# Patient Record
Sex: Female | Born: 1965 | Race: White | Hispanic: Yes | Smoking: Never smoker
Health system: Southern US, Community
[De-identification: ages and names within clinical notes are randomized; demographics above are authoritative.]

## PROBLEM LIST (undated history)

## (undated) DIAGNOSIS — Z8042 Family history of malignant neoplasm of prostate: Secondary | ICD-10-CM

## (undated) DIAGNOSIS — Z800 Family history of malignant neoplasm of digestive organs: Secondary | ICD-10-CM

## (undated) DIAGNOSIS — Z803 Family history of malignant neoplasm of breast: Secondary | ICD-10-CM

## (undated) HISTORY — DX: Family history of malignant neoplasm of digestive organs: Z80.0

## (undated) HISTORY — DX: Family history of malignant neoplasm of breast: Z80.3

## (undated) HISTORY — PX: CHOLECYSTECTOMY: SHX55

## (undated) HISTORY — DX: Family history of malignant neoplasm of prostate: Z80.42

## (undated) HISTORY — PX: SHOULDER SURGERY: SHX246

---

## 2010-07-24 DIAGNOSIS — K900 Celiac disease: Secondary | ICD-10-CM | POA: Insufficient documentation

## 2018-03-10 ENCOUNTER — Other Ambulatory Visit: Payer: Self-pay

## 2018-03-10 ENCOUNTER — Encounter: Payer: Self-pay | Admitting: Emergency Medicine

## 2018-03-10 ENCOUNTER — Ambulatory Visit
Admission: EM | Admit: 2018-03-10 | Discharge: 2018-03-10 | Disposition: A | Discharge status: Home / Self Care | Payer: BLUE CROSS/BLUE SHIELD | Attending: Family Medicine | Admitting: Family Medicine

## 2018-03-10 DIAGNOSIS — T07XXXA Unspecified multiple injuries, initial encounter: Secondary | ICD-10-CM

## 2018-03-10 DIAGNOSIS — L089 Local infection of the skin and subcutaneous tissue, unspecified: Secondary | ICD-10-CM

## 2018-03-10 MED ORDER — CYCLOBENZAPRINE HCL 10 MG PO TABS: 10.0000 mg | ORAL_TABLET | Freq: Every day | ORAL | 0 refills | Status: DC

## 2018-03-10 MED ORDER — SULFAMETHOXAZOLE-TRIMETHOPRIM 800-160 MG PO TABS: 1.0000 | ORAL_TABLET | Freq: Two times a day (BID) | ORAL | 0 refills | Status: DC
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
## 2018-03-20 ENCOUNTER — Other Ambulatory Visit: Payer: Self-pay | Admitting: Neurology

## 2018-03-20 DIAGNOSIS — H9311 Tinnitus, right ear: Secondary | ICD-10-CM

## 2018-03-20 DIAGNOSIS — F0781 Postconcussional syndrome: Secondary | ICD-10-CM

## 2018-03-20 DIAGNOSIS — R42 Dizziness and giddiness: Secondary | ICD-10-CM

## 2018-04-07 ENCOUNTER — Ambulatory Visit: Payer: BLUE CROSS/BLUE SHIELD | Attending: Neurology | Admitting: Physical Therapy

## 2018-04-07 DIAGNOSIS — H8113 Benign paroxysmal vertigo, bilateral: Secondary | ICD-10-CM

## 2018-04-07 DIAGNOSIS — M542 Cervicalgia: Secondary | ICD-10-CM | POA: Diagnosis not present

## 2018-04-07 DIAGNOSIS — R42 Dizziness and giddiness: Secondary | ICD-10-CM | POA: Diagnosis present
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
## 2018-04-08 ENCOUNTER — Encounter: Payer: Self-pay | Admitting: Physical Therapy

## 2018-04-09 ENCOUNTER — Ambulatory Visit: Payer: BLUE CROSS/BLUE SHIELD | Admitting: Physical Therapy

## 2018-04-09 ENCOUNTER — Encounter: Payer: Self-pay | Admitting: Physical Therapy

## 2018-04-09 DIAGNOSIS — R42 Dizziness and giddiness: Secondary | ICD-10-CM

## 2018-04-09 DIAGNOSIS — M542 Cervicalgia: Secondary | ICD-10-CM
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
## 2018-04-13 ENCOUNTER — Ambulatory Visit: Payer: BLUE CROSS/BLUE SHIELD | Admitting: Physical Therapy

## 2018-04-13 ENCOUNTER — Encounter: Payer: Self-pay | Admitting: Physical Therapy

## 2018-04-13 DIAGNOSIS — M542 Cervicalgia: Secondary | ICD-10-CM

## 2018-04-13 NOTE — Patient Instructions (Signed)
Access Code: [email or web address]  URL: [email or web address]  Date: 04/13/2018  Prepared by: Sheria Lang   Exercises  Standing Cervical Retraction - 10 reps - 1 sets - 5 hold - 2x daily - 7x weekly  Shoulder External Rotation and Scapular Retraction with Resistance - 10 reps - 3 sets - 3 hold - 1x daily - 4x weekly  Standing Shoulder Row with Resistance - 10 reps - 3 sets - 3 hold - 1x daily - 4x weekly

## 2018-04-13 NOTE — Therapy (Signed)
[place] [facility] [address] [place], [place], [address] Phone: [phone]   Fax:  [phone]  Physical Therapy Treatment  Patient Details  Name: Caitlin Chandler MRN: [id number] Date of Birth: 1965-11-15 No data recorded  Encounter Date: 04/13/2018  PT End of Session - 04/13/18 1553    Visit Number  3    Number of Visits  12    Date for PT Re-Evaluation  05/05/18    Authorization Type  3/10 Progress Note    PT Start Time  1442    PT Stop Time  1536    PT Time Calculation (min)  54 min    Activity Tolerance  Patient tolerated treatment well;No increased pain    Behavior During Therapy  [facility] for tasks assessed/performed       History reviewed. No pertinent past medical history.  Past Surgical History:  Procedure Laterality Date  . SHOULDER SURGERY Left     There were no vitals filed for this visit.  Subjective Assessment - 04/13/18 1548    Subjective  Patient reports that she is feeling much better since her initial evaluation. She was excited to report that she was able to go to dinner with family in a noisy restaurant and had no exacerbation of neck pain or headache. She estimated that with the drive there, the dinner probably took at least 3 hours. She did report that she is nauseuous for ~2 hours after her vestibular HEP.    Currently in Pain?  No/denies       TREATMENT  Therapeutic Exercise: Supine chin tucks 10x 5 sec hold Seated chin tucks 10 x 5 sec hold Seated Scap retraction with ER x10, RTB Unilateral rows B 2x10, RTB Weight bearing through hands (modified chaturunga prep for yoga prep) x5  Patient educated on grading activity for returning to previous physical activities and gradually returning to her usual routine.  Manual Therapy: Prone CPA/UPAs T7-C4 Supine subocciptal release Supine cervical extension mobilizations, tender at C4/5, no change with repetition 2-3/10 pain Supine upper trap stretch B,  4x20 sec Supine levator scap stretch B, 4x20 sec Seated UT/ periscapular STM    PT Education - 04/13/18 1551    Education Details  graded exposure to activity (yoga, body pump), exercise technique    Person(s) Educated  Patient    Methods  Explanation;Demonstration;Tactile cues;Verbal cues;Handout    Comprehension  Verbalized understanding;Returned demonstration          PT Long Term Goals - 04/07/18 1852      PT LONG TERM GOAL #1   Title  Pt will be independent with HEP in order to improve strength and decrease back pain in order to improve pain-free function at home and work.       Baseline  Pt. not given HEP at this time.    Time  4    Period  Weeks    Status  New    Target Date  05/05/18      PT LONG TERM GOAL #2   Title  Pt. will complete FOTO and improve to 71 to improve daily functional mobility.    Baseline  04/07/18 FOTO: 58    Time  4    Period  Weeks    Status  New    Target Date  05/05/18      PT LONG TERM GOAL #3   Title  Pt will decrease worst neck pain as reported on NPRS by at least 2 points  in order to demonstrate clinically significant reduction in back pain.     Baseline  Pt. unable to give NPRS at this time.    Time  4    Period  Weeks    Status  New    Target Date  05/05/18      PT LONG TERM GOAL #4   Title  Pt. will decrease dizziness/vertigo symptom severity to be 0/10 after Dix-Hallpike test.    Baseline  04/07/18: Dix-Hallpike Sx Severity R/L: 6/5    Time  4    Period  Weeks    Status  New    Target Date  05/05/18            Plan - 04/13/18 1555    Clinical Impression Statement Patient presents with improved function and decreased pain (2/10) and was amenable to therapy. She continues to have deficits in pain (neck and upper back) and activity tolerance (driving <16 minutes) which impair her ability to function at her prior level. She will continue to benefit from skilled therapeutic intervention to address her remaining deficits and  improve her overall QOL.   Rehab Potential  Good    PT Frequency  3x / week    PT Duration  4 weeks    PT Treatment/Interventions  Canalith Repostioning;Cryotherapy;Electrical Stimulation;Traction;Moist Heat;Gait training;Stair training;Functional mobility training;Therapeutic activities;Therapeutic exercise;Balance training;Neuromuscular re-education;Patient/family education;Manual techniques;Vestibular;Taping;Splinting;Dry needling;Passive range of motion;Visual/perceptual remediation/compensation;Joint Manipulations;Spinal Manipulations    PT Next Visit Plan  Progress postural strengthening as tolerated. Review HEP; try activites on uneven surfaces, progressions of vestibular exercises    PT Home Exercise Plan  VOR x1 in standing 30 seconds, in sitting-ball toss to self with head/eye follows horizontal and vertical     Consulted and Agree with Plan of Care  Patient       Patient will benefit from skilled therapeutic intervention in order to improve the following deficits and impairments:  Decreased activity tolerance, Increased fascial restricitons, Decreased balance, Decreased mobility, Impaired vision/preception, Decreased cognition, Dizziness, Decreased coordination, Impaired perceived functional ability, Pain  Visit Diagnosis: Pain in neck     Problem List There are no active problems to display for this patient.  Sheria Lang PT, DPT [phone]  04/13/2018, 3:56 PM  [place] [facility] [address] [place], [place], [address] Phone: [phone]   Fax:  [phone]  Name: Caitlin Chandler MRN: [id number] Date of Birth: 1966-03-17

## 2018-04-15 ENCOUNTER — Encounter: Payer: BLUE CROSS/BLUE SHIELD | Admitting: Physical Therapy

## 2018-04-16 ENCOUNTER — Ambulatory Visit: Payer: BLUE CROSS/BLUE SHIELD | Admitting: Physical Therapy

## 2018-04-16 DIAGNOSIS — R42 Dizziness and giddiness: Secondary | ICD-10-CM

## 2018-04-16 DIAGNOSIS — H8113 Benign paroxysmal vertigo, bilateral: Secondary | ICD-10-CM

## 2018-04-16 DIAGNOSIS — M542 Cervicalgia: Secondary | ICD-10-CM | POA: Diagnosis not present

## 2018-04-16 NOTE — Therapy (Addendum)
[place] [facility] [address] [place], [place], [address] Phone: [phone]   Fax:  [phone]  Physical Therapy Treatment  Patient Details  Name: Caitlin Chandler MRN: [id number] Date of Birth: [birth year] No data recorded  Encounter Date: 04/16/2018  PT End of Session - 04/16/18 1313    Visit Number  4    Number of Visits  12    Date for PT Re-Evaluation  05/05/18    Authorization Type  4/10 Progress Note    PT Start Time  1311    Activity Tolerance  Patient tolerated treatment well;No increased pain    Behavior During Therapy  [facility] for tasks assessed/performed       No past medical history on file.  Past Surgical History:  Procedure Laterality Date  . SHOULDER SURGERY Left     There were no vitals filed for this visit.  Subjective Assessment - 04/16/18 1312    Subjective  Patient states yesterday was the best day that I have had yet. Patient states she is now tolerating the exercises well and the nausea has subsided. Patient states she is still misplacing and forgetting things.     Pain Score  3     Pain Location  Head    Pain Orientation  Posterior    Pain Type  Acute pain ; patient reports she feels tension in the posterior head region   Pain Onset  Today       Patient states she has progressed from 30 seconds to 1 minute with the VOR. Patient states she feels better with driving around town. Patient states she still feels worn out at times. Patient states she drove to [facility] on Monday without issue. Patient reports she gets dizzy with looking down and then turning head side to side.    Neuromuscular Re-education: VOR X 1 exercise:  Patient performed VOR X 1 horizontal in standing with conflicting background 3 reps of 1 minute each with verbal cues for technique, speed of movement.   Bounce Passes: Patient performed ambulation 34' trials while doing alternating sides bounce passes to self with ball while tracking  ball with eyes and head with supervision. Patient denied increase in dizziness with this activity.    Body Wall Rolls:  Patient performed 4 reps of supported, body wall rolls with eyes open.  Patient reports 4-5/10 dizziness with this activity.  Added this exercise to HEP.   Airex pad:  On Airex pad, patient performed feet together progressions-progressed to feet and semi-tandem progressions with alternating lead leg with and without body turns. Patient reports this is challenging to her balance.  Cone Sorting Activity: Patient reports that she is getting mild dizziness when she looks down and bends over and then comes back up so tried the following exercise. On firm surface and then repeated on Airex pad, performed transferring multicolored cones from floor to elevated tray table 180 degrees on the other side of patient while patient visually tracks the cone so that patient is required to turn 180 degrees Left and Right to turn to bend over and pick and place cones on the table with S.  Pt without evidence of imbalance and patient reports increase in symptoms to 3/10 with this activity.      PT Education - 04/16/18 1313    Education Details reviewed HEP and added body wall rolls, ambulation with head turns, feet together with head turns, advanced VOR to conflicting background   Person(s) Educated  Patient  Methods  Explanation, demonstrated, verbal cues, handout   Comprehension  Verbalized understanding, returned demonstration       PT Long Term Goals - 04/07/18 1852      PT LONG TERM GOAL #1   Title  Pt will be independent with HEP in order to improve strength and decrease back pain in order to improve pain-free function at home and work.       Baseline  Pt. not given HEP at this time.    Time  4    Period  Weeks    Status  New    Target Date  05/05/18      PT LONG TERM GOAL #2   Title  Pt. will complete FOTO and improve to 71 to improve daily functional mobility.     Baseline  04/07/18 FOTO: 58    Time  4    Period  Weeks    Status  New    Target Date  05/05/18      PT LONG TERM GOAL #3   Title  Pt will decrease worst neck pain as reported on NPRS by at least 2 points in order to demonstrate clinically significant reduction in back pain.     Baseline  Pt. unable to give NPRS at this time.    Time  4    Period  Weeks    Status  New    Target Date  05/05/18      PT LONG TERM GOAL #4   Title  Pt. will decrease dizziness/vertigo symptom severity to be 0/10 after Dix-Hallpike test.    Baseline  04/07/18: Dix-Hallpike Sx Severity R/L: 6/5    Time  4    Period  Weeks    Status  New    Target Date  05/05/18         Plan - 04/27/18 1228    Clinical Impression Statement  Patient reports that she had a very good day yesterday and is encouraged by the progress she has made thus far. Patient reports compliance with HEP and states she is no longer getting nausea symptoms. Patient able to progress to conflicting background with VOR exercise and added body wall rolls and ambulation with random head turns, and feet together with head and body turns to HEP. Patient challenged by body wall rolls this date with reproduction of her dizziness symptoms. Patient would benefit from continued PT services to further address goals as set on plan of care and to try to decrease patient's symptoms of dizziness and imbalance.     Rehab Potential  Good    PT Frequency  3x / week    PT Duration  4 weeks    PT Treatment/Interventions  Canalith Repostioning;Cryotherapy;Electrical Stimulation;Traction;Moist Heat;Gait training;Stair training;Functional mobility training;Therapeutic activities;Therapeutic exercise;Balance training;Neuromuscular re-education;Patient/family education;Manual techniques;Vestibular;Taping;Splinting;Dry needling;Passive range of motion;Visual/perceptual remediation/compensation;Joint Manipulations;Spinal Manipulations    PT Next Visit Plan  Progress postural  strengthening as tolerated. Review HEP; try activites on uneven surfaces, progressions of vestibular exercises    PT Home Exercise Plan  VOR x1 in standing 1 minute with conflicting background, in sitting-ball toss to self with head/eye follows horizontal and vertical, body wall rolls supported EO, ambulation with head turns, feet together with head and body turns    Consulted and Agree with Plan of Care  Patient       Patient will benefit from skilled therapeutic intervention in order to improve the following deficits and impairments:  Decreased activity tolerance, Increased fascial restricitons, Decreased balance, Decreased  mobility, Impaired vision/preception, Decreased cognition, Dizziness, Decreased coordination, Impaired perceived functional ability, Pain  Visit Diagnosis: Dizziness and giddiness   Problem List There are no active problems to display for this patient.  Mardelle Matte PT, DPT [phone] Mardelle Matte 04/16/2018, 1:14 PM  [place] [facility] [address] [place], [place], [address] Phone: [phone]   Fax:  [phone]  Name: Jeslie Lowe MRN: [id number] Date of Birth: 1966/05/24

## 2018-04-20 ENCOUNTER — Encounter: Payer: Self-pay | Admitting: Physical Therapy

## 2018-04-20 ENCOUNTER — Ambulatory Visit: Payer: BLUE CROSS/BLUE SHIELD | Admitting: Physical Therapy

## 2018-04-20 DIAGNOSIS — M542 Cervicalgia: Secondary | ICD-10-CM | POA: Diagnosis not present

## 2018-04-20 DIAGNOSIS — R42 Dizziness and giddiness: Secondary | ICD-10-CM

## 2018-04-20 NOTE — Therapy (Signed)
[place] [facility] [address] [place], [place], [address] Phone: [phone]   Fax:  [phone]  Physical Therapy Treatment  Patient Details  Name: Caitlin Chandler MRN: [id number] Date of Birth: [birth year] No data recorded  Encounter Date: 04/20/2018    PT End of Session - 04/27/18 1156    Visit Number  5    Number of Visits  12    Date for PT Re-Evaluation  05/05/18    Authorization Type  5/10 Progress Note    PT Start Time  1551    PT Stop Time  1644    PT Time Calculation (min)  53 min    Activity Tolerance  Patient tolerated treatment well;No increased pain    Behavior During Therapy  [facility] for tasks assessed/performed        History reviewed. No pertinent past medical history.  Past Surgical History:  Procedure Laterality Date  . SHOULDER SURGERY Left     There were no vitals filed for this visit.  Subjective Assessment - 04/20/18 1553    Subjective  Patient states she went camping over the weekend and was able to be a passenger in the car for a several hour car trip and she did well. Patient states she does feel her depth perception is off and she gets startled at time with thinking that their car is too close to another car.     Currently in Pain?  No/denies    Pain Onset  Today       Patient reports she took a Tylenol after therapy the last time and has not had to take any other.  She did not get a chance to do her exercises over the weekend.  Patient states having gallbladder surgery in a month.   Neuromuscular Re-education:  Walking VOR X 1 exercise:  Patient performed VOR X 1 horizontal in standing with conflicting background 3 reps of 1 minute each with verbal cues for technique.  Patient denies dizziness with this activity.   Newman Pies toss over shoulder: Patient performed multiple 61' trials of forward and retro ambulation while tossing ball over one shoulder with return catch over opposite shoulder with CGA.  Patient reports increase in dizziness with this activity. Patient performed multiple 54' trials of forward and retro ambulation while tossing ball over one shoulder with return catch over opposite shoulder varying the ball position to head, shoulder and waist level to promote head turning and tilting with CGA.  Patient reports 6-7/10 dizziness with forward ambulation with varying shoulder heights.   Ambulation with head turns:   Patient performed 175' trials of retro ambulation with horizontal, diagonal and vertical head turns with CGA.   Patient demonstrates mild veering at times with retro ambulation with head turns.  Issued forward ambulation with head turns in hallway for HEP.   Airex pad:  On Airex pad, patient performed feet together progressions and semi-tandem progressions with alternating lead leg with and without body turns and horizontal and vertical head turns.  Patient performed feet together with eyes closed on Airex pad multiple reps of 30 second holds with CGA. Patient with increased sway noted.   Body Wall Rolls:  Patient performed 2 reps of unsupported, body wall rolls with eyes open.  Patient performed 2 reps of supported, body wall rolls with eyes closed.  Patient with increased sway with this activity and patient reports increased dizziness with this activity,    Education     Education Details: reviewed  HEP and added progressions to body wall rolls and ambulation with head turns  Person educated: Patient    Method: Explanation, demonstrated, verbal cues  Comprehension: verbalized understanding, returned demonstration     PT Long Term Goals - 04/07/18 1852      PT LONG TERM GOAL #1   Title  Pt will be independent with HEP in order to improve strength and decrease back pain in order to improve pain-free function at home and work.       Baseline  Pt. not given HEP at this time.    Time  4    Period  Weeks    Status  New    Target Date  05/05/18      PT LONG  TERM GOAL #2   Title  Pt. will complete FOTO and improve to 71 to improve daily functional mobility.    Baseline  04/07/18 FOTO: 58    Time  4    Period  Weeks    Status  New    Target Date  05/05/18      PT LONG TERM GOAL #3   Title  Pt will decrease worst neck pain as reported on NPRS by at least 2 points in order to demonstrate clinically significant reduction in back pain.     Baseline  Pt. unable to give NPRS at this time.    Time  4    Period  Weeks    Status  New    Target Date  05/05/18      PT LONG TERM GOAL #4   Title  Pt. will decrease dizziness/vertigo symptom severity to be 0/10 after Dix-Hallpike test.    Baseline  04/07/18: Dix-Hallpike Sx Severity R/L: 6/5    Time  4    Period  Weeks    Status  New    Target Date  05/05/18         Plan - 04/27/18 1157    Clinical Impression Statement  Patient progressing well with therapy and able to work on progressions of body wall roll and standing on Airex pad with feet together with eyes closed. Patient reports that she was able to go on a camping group and long car trip this last weekend and she report that she was able to tolerate this well without increase in symptoms. Patient would benefit from continued PT services to continue to work on progressions of vestibular activities and to try to decrease patient's subjective symptoms.     Rehab Potential  Good    PT Frequency  3x / week    PT Duration  4 weeks    PT Treatment/Interventions  Canalith Repostioning;Cryotherapy;Electrical Stimulation;Traction;Moist Heat;Gait training;Stair training;Functional mobility training;Therapeutic activities;Therapeutic exercise;Balance training;Neuromuscular re-education;Patient/family education;Manual techniques;Vestibular;Taping;Splinting;Dry needling;Passive range of motion;Visual/perceptual remediation/compensation;Joint Manipulations;Spinal Manipulations    PT Next Visit Plan  Progress postural strengthening as tolerated. Review HEP; try  activites on uneven surfaces, progressions of vestibular exercises    PT Home Exercise Plan  VOR x1 in standing 1 minute with conflicting backgorund, in sitting and standing-ball toss to self with head/eye follows horizontal and vertical, body wall rolls, ambulation with head turns in hallway     Consulted and Agree with Plan of Care  Patient             Patient will benefit from skilled therapeutic intervention in order to improve the following deficits and impairments:     Visit Diagnosis: Dizziness and giddiness     Problem List There are no  active problems to display for this patient.  Mardelle Matte PT, DPT [phone] Mardelle Matte 04/20/2018, 3:54 PM  [place] [facility] [address] [place], [place], [address] Phone: [phone]   Fax:  [phone]  Name: Terica Yogi MRN: [id number] Date of Birth: 05-09-1966

## 2018-04-21 ENCOUNTER — Encounter: Payer: Self-pay | Admitting: Physical Therapy

## 2018-04-21 ENCOUNTER — Ambulatory Visit: Payer: BLUE CROSS/BLUE SHIELD | Admitting: Physical Therapy

## 2018-04-21 DIAGNOSIS — M542 Cervicalgia: Secondary | ICD-10-CM

## 2018-04-21 DIAGNOSIS — R42 Dizziness and giddiness: Secondary | ICD-10-CM

## 2018-04-21 DIAGNOSIS — H8113 Benign paroxysmal vertigo, bilateral: Secondary | ICD-10-CM

## 2018-04-21 NOTE — Therapy (Signed)
[facility] [address] [place], [place], [address] Phone: [phone]   Fax:  [phone]  Physical Therapy Treatment  Patient Details  Name: Caitlin Chandler MRN: [id number] Date of Birth: 02/18/1966 No data recorded  Encounter Date: 04/21/2018  PT End of Session - 04/21/18 1536    Visit Number  6    Number of Visits  12    Date for PT Re-Evaluation  05/05/18    Authorization Type  6/10 Progress Note    PT Start Time  1443    PT Stop Time  1541    PT Time Calculation (min)  58 min    Activity Tolerance  Patient tolerated treatment well;No increased pain    Behavior During Therapy  [facility] for tasks assessed/performed       History reviewed. No pertinent past medical history.  Past Surgical History:  Procedure Laterality Date  . SHOULDER SURGERY Left     There were no vitals filed for this visit.  Subjective Assessment - 04/21/18 1445    Subjective  Patient reports that after yesterday's session she was nauseous with a headache for 2-3 hours. She also reports that driving to [facility] today she was not comfortable and had a headache from tension in her neck. She would like to work on her neck today and her vestibular on Thursday. She is excited to attend a yoga class tonight.    Currently in Pain?  No/denies   tension in neck   Pain Onset  Today    Multiple Pain Sites  No         TREATMENT  Therapeutic Exercise: Car seat/headrest positioning to relieve neck tension Seated scapular retractions 15x 5 sec Seated chin tucks 10x5 sec Yoga progression:  -child's pose x30sec  -quadruped scapular protractions/retractions x5  -cat-cow x5  -quadruped knee hover x10sec  -downward facing dog, tread feet x30 sec  -chatarunga/plank transition x15 sec  -upward facing dog x10 sec  -cobra x15 sec  -cobra w/o UE x4  -upward facing dogx10sec  -downward facing dogx10sec  -forearm hip pikex20sec  -forearm stand  kicks/floatsx10  -forearm stand (Min A for hold)x20sec  -child's pose x20sec  -kneeling x30sec Pt reported no increased pain or dizziness after inversions.  Patient educated on postural expectations, gradual progressions for scapular and cervical strengthening, and appropriate modifications and pacing for yoga classes.  Manual Therapy: STM/MFR R upper trapezius, periscapular area, cervical paraspinals. Pt reported decreased tension after manual interventions.    PT Long Term Goals - 04/07/18 1852      PT LONG TERM GOAL #1   Title  Pt will be independent with HEP in order to improve strength and decrease back pain in order to improve pain-free function at home and work.       Baseline  Pt. not given HEP at this time.    Time  4    Period  Weeks    Status  New    Target Date  05/05/18      PT LONG TERM GOAL #2   Title  Pt. will complete FOTO and improve to 71 to improve daily functional mobility.    Baseline  04/07/18 FOTO: 58    Time  4    Period  Weeks    Status  New    Target Date  05/05/18      PT LONG TERM GOAL #3   Title  Pt will decrease worst neck pain as reported on NPRS  by at least 2 points in order to demonstrate clinically significant reduction in back pain.     Baseline  Pt. unable to give NPRS at this time.    Time  4    Period  Weeks    Status  New    Target Date  05/05/18      PT LONG TERM GOAL #4   Title  Pt. will decrease dizziness/vertigo symptom severity to be 0/10 after Dix-Hallpike test.    Baseline  04/07/18: Dix-Hallpike Sx Severity R/L: 6/5    Time  4    Period  Weeks    Status  New    Target Date  05/05/18            Plan - 04/21/18 1555    Clinical Impression Statement  Patient presents to clinic with increased tension in her neck, but overall very motivated to participate in therapy. Patient demonstrates deficits in postural endurance, balance, and vestibular function as evidenced by her limitations with driving >1 hr, reports of nausea  and headache after sessions focused on vestibular interventions. Patient will benefit from continued skilled therapeutic intervention to address deficits in balance, vestibular function, and postural endurance in order to return to PLOF and participate fully in community activities.    Rehab Potential  Good    PT Frequency  3x / week    PT Duration  4 weeks    PT Treatment/Interventions  Canalith Repostioning;Cryotherapy;Electrical Stimulation;Traction;Moist Heat;Gait training;Stair training;Functional mobility training;Therapeutic activities;Therapeutic exercise;Balance training;Neuromuscular re-education;Patient/family education;Manual techniques;Vestibular;Taping;Splinting;Dry needling;Passive range of motion;Visual/perceptual remediation/compensation;Joint Manipulations;Spinal Manipulations    PT Next Visit Plan  Explore circuit weight training in order to mimic pt's prior routine. Review HEP; try activites on uneven surfaces, progressions of vestibular exercises    PT Home Exercise Plan  VOR x1 in standing 30 seconds, in sitting-ball toss to self with head/eye follows horizontal and vertical     Consulted and Agree with Plan of Care  Patient       Patient will benefit from skilled therapeutic intervention in order to improve the following deficits and impairments:  Decreased activity tolerance, Increased fascial restricitons, Decreased balance, Decreased mobility, Impaired vision/preception, Decreased cognition, Dizziness, Decreased coordination, Impaired perceived functional ability, Pain  Visit Diagnosis: Dizziness and giddiness  BPPV (benign paroxysmal positional vertigo), bilateral  Pain in neck     Problem List There are no active problems to display for this patient.   Sheria Lang PT, DPT [phone] 04/21/2018, 4:15 PM  [facility] [address] [place], [place], [address] Phone: [phone]   Fax:  [phone]  Name:  Caitlin Chandler MRN: [id number] Date of Birth: [birth year]

## 2018-04-22 ENCOUNTER — Encounter: Payer: BLUE CROSS/BLUE SHIELD | Admitting: Physical Therapy

## 2018-04-23 ENCOUNTER — Ambulatory Visit: Payer: BLUE CROSS/BLUE SHIELD | Admitting: Physical Therapy

## 2018-04-23 ENCOUNTER — Encounter: Payer: Self-pay | Admitting: Physical Therapy

## 2018-04-23 DIAGNOSIS — M542 Cervicalgia: Secondary | ICD-10-CM

## 2018-04-23 DIAGNOSIS — R42 Dizziness and giddiness: Secondary | ICD-10-CM

## 2018-04-23 DIAGNOSIS — H8113 Benign paroxysmal vertigo, bilateral: Secondary | ICD-10-CM
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
## 2018-04-27 ENCOUNTER — Encounter: Payer: Self-pay | Admitting: Physical Therapy

## 2018-04-27 ENCOUNTER — Ambulatory Visit: Payer: BLUE CROSS/BLUE SHIELD | Admitting: Physical Therapy

## 2018-04-27 DIAGNOSIS — M542 Cervicalgia: Secondary | ICD-10-CM | POA: Diagnosis not present

## 2018-04-27 DIAGNOSIS — R42 Dizziness and giddiness: Secondary | ICD-10-CM
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
## 2018-04-29 ENCOUNTER — Ambulatory Visit: Payer: BLUE CROSS/BLUE SHIELD | Admitting: Physical Therapy

## 2018-04-29 DIAGNOSIS — M542 Cervicalgia: Secondary | ICD-10-CM | POA: Diagnosis not present

## 2018-04-29 DIAGNOSIS — H8113 Benign paroxysmal vertigo, bilateral: Secondary | ICD-10-CM

## 2018-04-29 DIAGNOSIS — R42 Dizziness and giddiness: Secondary | ICD-10-CM

## 2018-04-29 NOTE — Therapy (Signed)
[place] [facility] [address] [place], [place], [address] Phone: [phone]   Fax:  [phone]  Physical Therapy Treatment  Patient Details  Name: Caitlin Chandler MRN: [id number] Date of Birth: Jun 15, 1966 No data recorded  Encounter Date: 04/29/2018     Treatment: 9 of 12.  Recert date: 05/05/18 1352 to 1448    History reviewed. No pertinent past medical history.  Past Surgical History:  Procedure Laterality Date  . SHOULDER SURGERY Left     There were no vitals filed for this visit.      Pt. notes she is somewhat nervous for her surgical proedure on Friday, however has support system around with friends and clinicians reassuring her that it will be fine and everything will be ok. Pt. notes she has becoming more comfortable with [place] is seeing cars much better on the road in peripherals and further away. Pt. notes she is feeling much better      TREATMENT   Manual Therapy:  Seated Trigger Point Release to B UT and Cervical Region Supine Generalized Stretching to Cervical/UT in all planes of movement, 30 sec x multiple bouts Prone Trigger Point Release to B UT and Cervical Region    There Ex:  Supine B Chest Press 4# dumbbell x40 each Supine B Hammer Curl/Tricep extension 4# dumbbell x40 each Supine B Adduction/Abduction 4# dumbbell x20 each   MMT of B UE Generalized Weakness on L UE (4/5) from prior surgery           Pt. responded well to trigger point release technique during manual therapy and noted relief after treatment. Pt. educated on dry-needling technique in order to release trigger points in UT and was agreeable to treatment. Pt. responded well with muscular fasciculation occuring on several different occasions. Decrease trigger point muscle tendenress noted but generalized UT soreness after manual tx./ trigger point release technique. Pt. instructed to contact PT if any change in symptoms  prior to next tx. session.      PT Long Term Goals - 04/07/18 1852      PT LONG TERM GOAL #1   Title  Pt will be independent with HEP in order to improve strength and decrease back pain in order to improve pain-free function at home and work.       Baseline  Pt. not given HEP at this time.    Time  4    Period  Weeks    Status  New    Target Date  05/05/18      PT LONG TERM GOAL #2   Title  Pt. will complete FOTO and improve to 71 to improve daily functional mobility.    Baseline  04/07/18 FOTO: 58    Time  4    Period  Weeks    Status  New    Target Date  05/05/18      PT LONG TERM GOAL #3   Title  Pt will decrease worst neck pain as reported on NPRS by at least 2 points in order to demonstrate clinically significant reduction in back pain.     Baseline  Pt. unable to give NPRS at this time.    Time  4    Period  Weeks    Status  New    Target Date  05/05/18      PT LONG TERM GOAL #4   Title  Pt. will decrease dizziness/vertigo symptom severity to be 0/10 after Dix-Hallpike test.  Baseline  04/07/18: Dix-Hallpike Sx Severity R/L: 6/5    Time  4    Period  Weeks    Status  New    Target Date  05/05/18        Patient will benefit from skilled therapeutic intervention in order to improve the following deficits and impairments:  Decreased activity tolerance, Increased fascial restricitons, Decreased balance, Decreased mobility, Impaired vision/preception, Decreased cognition, Dizziness, Decreased coordination, Impaired perceived functional ability, Pain  Visit Diagnosis: Dizziness and giddiness  BPPV (benign paroxysmal positional vertigo), bilateral  Pain in neck     Problem List There are no active problems to display for this patient.  Cammie Mcgee, PT, DPT # [id number] Nolon Bussing, SPT 05/01/2018, 7:50 AM  [place] [facility] [address] [place], [place], [address] Phone: [phone]   Fax:  [phone]  Name:  Caitlin Chandler MRN: [id number] Date of Birth: 06-24-1966

## 2018-04-30 ENCOUNTER — Encounter: Payer: Self-pay | Admitting: Physical Therapy

## 2018-04-30 ENCOUNTER — Encounter: Payer: BLUE CROSS/BLUE SHIELD | Admitting: Physical Therapy

## 2018-05-04 ENCOUNTER — Ambulatory Visit: Payer: BLUE CROSS/BLUE SHIELD | Admitting: Physical Therapy

## 2018-05-06 ENCOUNTER — Ambulatory Visit: Payer: BLUE CROSS/BLUE SHIELD | Admitting: Physical Therapy

## 2018-05-06 DIAGNOSIS — H8113 Benign paroxysmal vertigo, bilateral: Secondary | ICD-10-CM

## 2018-05-06 DIAGNOSIS — R42 Dizziness and giddiness: Secondary | ICD-10-CM

## 2018-05-06 DIAGNOSIS — M542 Cervicalgia: Secondary | ICD-10-CM

## 2018-05-06 NOTE — Therapy (Signed)
[facility] [address]. [place], [place], [address] Phone: [phone]   Fax:  [phone]  May 06, 2018    Physical Therapy Discharge Summary  Patient: Caitlin Chandler  MRN: [id number]  Date of Birth: Nov 24, 1965   Diagnosis: Dizziness and giddiness  BPPV (benign paroxysmal positional vertigo), bilateral  Pain in neck No data recorded  The above patient had been seen in Physical Therapy 10 times of 12 treatments scheduled with 0 no shows.  The treatment consisted of therapeutic exercise, canalith repositioning, neuromuscular re-education, dry needling, manual therapy techniques.   The patient is: Improved  Subjective: Patient presents to clinic with decreased pain, decreased dizziness, and improved overall function. She states that she has been able to drive to-and-from [facility] every day this week and has not had any significant exacerbations in symptoms. Additionally, she has been able to return to yoga classes with some modifications. She notes that she does not feel like her strength has returned completely, but feels that it will return as she gets back to her routine. She is excited to begin managing her recovery independently with the HEP and strategies she learned in therapy. She is appropriate for d/c at this time, and has been advised to contact the clinic if she has any significant regression in function, pain, or dizziness that she is unable to self-manage.  Discharge Findings: FOTO: 87, full cervical ROM, decreased dizziness rating ("like a 2" if very fatigued), decreased pain rating (0/10)  Functional Status at Discharge: Prior level of function: independent  All Goals Met PT Long Term Goals - 05/06/18 1715      PT LONG TERM GOAL #1   Title  Pt will be independent with HEP in order to improve strength and decrease back pain in order to improve pain-free function at home and work.       Baseline  Pt. not given HEP  at this time.    Time  4    Period  Weeks    Status  Achieved    Target Date  05/05/18      PT LONG TERM GOAL #2   Title  Pt. will complete FOTO and improve to 71 to improve daily functional mobility.    Baseline  04/07/18 FOTO: 58; 05/06/2018: 87    Time  4    Period  Weeks    Status  Achieved    Target Date  05/05/18      PT LONG TERM GOAL #3   Title  Pt will decrease worst neck pain as reported on NPRS by at least 2 points in order to demonstrate clinically significant reduction in back pain.     Baseline  Pt. unable to give NPRS at this time.; 05/06/2018: 0/10    Time  4    Period  Weeks    Status  Achieved    Target Date  05/05/18      PT LONG TERM GOAL #4   Title  Pt. will decrease dizziness/vertigo symptom severity to be 0/10 after Dix-Hallpike test.    Baseline  04/07/18: Dix-Hallpike Sx Severity R/L: 6/5; 05/06/2018: Patient reports only mild dizziness with fast head turns, but does not report it impacting her function.    Time  4    Period  Weeks    Status  Deferred    Target Date  05/05/18         Sincerely,  Myles Gip PT, DPT [phone]     [facility] [address] [place], [place], [address] Phone: [phone]   Fax:  [phone]  Patient: Telitha Plath  MRN: [id number]  Date of Birth: 04-05-1966

## 2018-05-07 ENCOUNTER — Ambulatory Visit: Payer: BLUE CROSS/BLUE SHIELD | Admitting: Physical Therapy

## 2020-03-25 ENCOUNTER — Ambulatory Visit (INDEPENDENT_AMBULATORY_CARE_PROVIDER_SITE_OTHER): Payer: BC Managed Care – PPO

## 2020-03-25 ENCOUNTER — Ambulatory Visit
Admission: EM | Admit: 2020-03-25 | Discharge: 2020-03-25 | Disposition: A | Discharge status: Home / Self Care | Payer: BC Managed Care – PPO

## 2020-03-25 ENCOUNTER — Other Ambulatory Visit: Payer: Self-pay

## 2020-03-25 ENCOUNTER — Encounter: Payer: Self-pay | Admitting: Emergency Medicine

## 2020-03-25 DIAGNOSIS — S99912A Unspecified injury of left ankle, initial encounter: Secondary | ICD-10-CM | POA: Diagnosis not present

## 2020-03-25 DIAGNOSIS — M25472 Effusion, left ankle: Secondary | ICD-10-CM

## 2020-03-25 DIAGNOSIS — S82832A Other fracture of upper and lower end of left fibula, initial encounter for closed fracture: Secondary | ICD-10-CM

## 2020-03-25 DIAGNOSIS — M25572 Pain in left ankle and joints of left foot: Secondary | ICD-10-CM | POA: Diagnosis not present

## 2020-03-25 MED ORDER — ONDANSETRON HCL 4 MG PO TABS: 4.0000 mg | ORAL_TABLET | Freq: Three times a day (TID) | ORAL | 0 refills | Status: DC | PRN

## 2020-03-25 MED ORDER — NAPROXEN 500 MG PO TABS: 500.0000 mg | ORAL_TABLET | Freq: Two times a day (BID) | ORAL | 0 refills | Status: DC

## 2020-03-25 NOTE — Discharge Instructions (Signed)
Ice, elevation and antiinflammatories, especially over the next 48 hours to help with swelling and pain.  Wear immobilizer, CAM boot.  I have provided nausea medication as well which you may use as needed.  Please call orthopedics on Monday for further evaluation and definitive treatment plan (surgery vs casting)

## 2020-03-25 NOTE — ED Triage Notes (Signed)
Patient states that she rolled her left ankle and felt a pop in her left ankle that happened about 30 min ago.  Patient c/o swelling and pain in her left ankle.

## 2020-03-26 NOTE — ED Provider Notes (Signed)
[facility]    CSN: [id number] Arrival date & time: 03/25/20  1549      History   Chief Complaint Chief Complaint  Patient presents with   Ankle Pain    left    HPI Caitlin Chandler is a 54 y.o. female.   Caitlin Chandler presents with complaints of left ankle pain and swelling after injury today. She was playing around, misstepped forward, causing her to roll ankle, over supination. Pain and swelling since. This occurred before she arrived. She took two oxycodone which she had left over from previous injury. No previous ankle break in the past. No numbness or tingling. Hasn't been able to bear weight.     ROS per HPI, negative if not otherwise mentioned.      History reviewed. No pertinent past medical history.  There are no problems to display for this patient.   Past Surgical History:  Procedure Laterality Date   CHOLECYSTECTOMY     SHOULDER SURGERY Left     OB History   No obstetric history on file.      Home Medications    Prior to Admission medications   Medication Sig Start Date End Date Taking? Authorizing Provider  Cholecalciferol 1.25 MG (50000 UT) capsule Take by mouth.   Yes [provider]  MYRBETRIQ 50 MG TB24 tablet Take 50 mg by mouth daily. 02/28/20  Yes [provider]  cyclobenzaprine (FLEXERIL) 10 MG tablet Take 1 tablet (10 mg total) by mouth at bedtime. 03/10/18   Payton Mccallum, MD  naproxen (NAPROSYN) 500 MG tablet Take 1 tablet (500 mg total) by mouth 2 (two) times daily. 03/25/20   Georgetta Haber, NP  ondansetron (ZOFRAN) 4 MG tablet Take 1 tablet (4 mg total) by mouth every 8 (eight) hours as needed for nausea or vomiting. 03/25/20   Georgetta Haber, NP  sulfamethoxazole-trimethoprim (BACTRIM DS,SEPTRA DS) 800-160 MG tablet Take 1 tablet by mouth 2 (two) times daily. 03/10/18   Payton Mccallum, MD  valACYclovir (VALTREX) 500 MG tablet Take by mouth. 03/01/15   [provider]    Family  History History reviewed. No pertinent family history.  Social History Social History   Tobacco Use   Smoking status: Never Smoker   Smokeless tobacco: Never Used  Building services engineer Use: Never used  Substance Use Topics   Alcohol use: Never   Drug use: Never     Allergies   Oxycodone   Review of Systems Review of Systems   Physical Exam Triage Vital Signs ED Triage Vitals  Enc Vitals Group     BP 03/25/20 1604 125/67     Pulse Rate 03/25/20 1604 88     Resp 03/25/20 1604 14     Temp 03/25/20 1604 98.7 F (37.1 C)     Temp Source 03/25/20 1604 Oral     SpO2 03/25/20 1604 99 %     Weight 03/25/20 1559 128 lb (58.1 kg)     Height 03/25/20 1559 5\' 6"  (1.676 m)     Head Circumference --      Peak Flow --      Pain Score 03/25/20 1559 5     Pain Loc --      Pain Edu? --      Excl. in GC? --    No data found.  Updated Vital Signs BP 125/67 (BP Location: Left Arm)    Pulse 88    Temp 98.7 F (37.1 C) (Oral)  Resp 14    Ht 5\' 6"  (1.676 m)    Wt 128 lb (58.1 kg)    LMP 03/02/2020 (Approximate)    SpO2 99%    BMI 20.66 kg/m   Visual Acuity Right Eye Distance:   Left Eye Distance:   Bilateral Distance:    Right Eye Near:   Left Eye Near:    Bilateral Near:     Physical Exam Constitutional:      General: She is not in acute distress.    Appearance: She is well-developed.  Cardiovascular:     Rate and Rhythm: Normal rate.  Pulmonary:     Effort: Pulmonary effort is normal.  Musculoskeletal:     Left ankle: Swelling present. No lacerations. Tenderness present over the lateral malleolus. Decreased range of motion.     Comments: Swelling and tenderness over left lateral ankle; strong pedal pulse; pain with ROM; cap refill < 2 seconds    Skin:    General: Skin is warm and dry.  Neurological:     Mental Status: She is alert and oriented to person, place, and time.      UC Treatments / Results  Labs (all labs ordered are listed, but only abnormal  results are displayed) Labs Reviewed - No data to display  EKG   Radiology DG Ankle Complete Left  Result Date: 03/25/2020 CLINICAL DATA:  Left ankle pain and swelling after injury. EXAM: LEFT ANKLE COMPLETE - 3+ VIEW COMPARISON:  None. FINDINGS: There is a mildly displaced fracture through the distal fibula with overlying soft tissue swelling. IMPRESSION: Mildly displaced fracture through the distal fibula with significant overlying soft tissue swelling. Electronically Signed   By: 03/27/2020 III M.D   On: 03/25/2020 16:29    Procedures Procedures (including critical care time)  Medications Ordered in UC Medications - No data to display  Initial Impression / Assessment and Plan / UC Course  I have reviewed the triage vital signs and the nursing notes.  Pertinent labs & imaging results that were available during my care of the patient were reviewed by me and considered in my medical decision making (see chart for details).     Distal fibula fracture after injury today. Cam walker and crutches provided. Pain management discussed. She states she has oxycodone at home. Ortho follow up recommended on Monday. Patient and S/O verbalized understanding and agreeable to plan.   Final Clinical Impressions(s) / UC Diagnoses   Final diagnoses:  Closed fracture of distal end of left fibula, unspecified fracture morphology, initial encounter     Discharge Instructions     Ice, elevation and antiinflammatories, especially over the next 48 hours to help with swelling and pain.  Wear immobilizer, CAM boot.  I have provided nausea medication as well which you may use as needed.  Please call orthopedics on Monday for further evaluation and definitive treatment plan (surgery vs casting)    ED Prescriptions    Medication Sig Dispense Auth. Provider   naproxen (NAPROSYN) 500 MG tablet Take 1 tablet (500 mg total) by mouth 2 (two) times daily. 30 tablet Tanaiya Kolarik, Tuesday B, NP   ondansetron  (ZOFRAN) 4 MG tablet Take 1 tablet (4 mg total) by mouth every 8 (eight) hours as needed for nausea or vomiting. 10 tablet Dorene Grebe, NP     PDMP not reviewed this encounter.   Georgetta Haber, NP 03/26/20 1010

## 2020-04-10 ENCOUNTER — Other Ambulatory Visit: Payer: BC Managed Care – PPO

## 2020-04-10 DIAGNOSIS — Z20822 Contact with and (suspected) exposure to covid-19: Secondary | ICD-10-CM

## 2020-04-11 LAB — NOVEL CORONAVIRUS, NAA: SARS-CoV-2, NAA: NOT DETECTED

## 2020-04-11 LAB — SARS-COV-2, NAA 2 DAY TAT

## 2020-04-13 ENCOUNTER — Other Ambulatory Visit: Payer: Self-pay

## 2020-04-13 ENCOUNTER — Ambulatory Visit
Admission: EM | Admit: 2020-04-13 | Discharge: 2020-04-13 | Disposition: A | Discharge status: Home / Self Care | Payer: BC Managed Care – PPO | Attending: Physician Assistant | Admitting: Physician Assistant

## 2020-04-13 DIAGNOSIS — R059 Cough, unspecified: Secondary | ICD-10-CM

## 2020-04-13 DIAGNOSIS — J209 Acute bronchitis, unspecified: Secondary | ICD-10-CM | POA: Diagnosis not present

## 2020-04-13 MED ORDER — DOXYCYCLINE HYCLATE 100 MG PO CAPS: 100.0000 mg | ORAL_CAPSULE | Freq: Two times a day (BID) | ORAL | 0 refills | Status: AC

## 2020-04-13 MED ORDER — CHERATUSSIN AC 100-10 MG/5ML PO SOLN: 5.0000 mL | Freq: Four times a day (QID) | ORAL | 0 refills | Status: AC | PRN

## 2020-04-13 NOTE — Discharge Instructions (Signed)
BRONCHITIS: Take Mucinex D throughout the day and drink plenty of fluids to break up the mucus . Take cough syrup at bedtime if needed, for cough and to assist with sleep. Take Ibuprofen or other NSAID for relief of any pleuritic pain. Increase rest and fluid intake. Return to the clinic, your PCP, or ER if you have any new/ worsening symptoms such as fever, chest pain, difficulty breathing, worsening cough, mental status changes, lethargy, etc.  °

## 2020-04-13 NOTE — ED Provider Notes (Signed)
[facility]    CSN: [id number] Arrival date & time: 04/13/20  0801      History   Chief Complaint Chief Complaint  Patient presents with   Cough    HPI Caitlin Chandler is a 54 y.o. female presenting for 2-week history of productive cough.  Patient also admits to low-grade temperatures of 99 degrees.  She says she feels a chest pressure, but denies chest pain.  She denies any breathing difficulty.  She is also has nasal congestion, but says she mostly feels congestion in her chest.  Patient reports 2 - Covid test with the last negative Covid test 3 days ago.  Denies any Covid exposure.  She has been fully vaccinated for Covid.  Patient states she has been taking over-the-counter cough medication without improvement in the cough.  She says that her cough keeps her up at night and she is having sleep in a different room so is not to wake up her partner.  Patient denies any history of cardiopulmonary disease.  She has no other complaints or concerns today.  HPI  History reviewed. No pertinent past medical history.  There are no problems to display for this patient.   Past Surgical History:  Procedure Laterality Date   CHOLECYSTECTOMY     SHOULDER SURGERY Left     OB History   No obstetric history on file.      Home Medications    Prior to Admission medications   Medication Sig Start Date End Date Taking? Authorizing Provider  Cholecalciferol 1.25 MG (50000 UT) capsule Take by mouth.    [provider]  cyclobenzaprine (FLEXERIL) 10 MG tablet Take 1 tablet (10 mg total) by mouth at bedtime. 03/10/18   Payton Mccallum, MD  doxycycline (VIBRAMYCIN) 100 MG capsule Take 1 capsule (100 mg total) by mouth 2 (two) times daily for 7 days. 04/13/20 04/20/20  Shirlee Latch, PA-C  guaiFENesin-codeine (CHERATUSSIN AC) 100-10 MG/5ML syrup Take 5 mLs by mouth 4 (four) times daily as needed for up to 7 days for cough. 04/13/20 04/20/20  Eusebio Friendly B, PA-C   MYRBETRIQ 50 MG TB24 tablet Take 50 mg by mouth daily. 02/28/20   [provider]  naproxen (NAPROSYN) 500 MG tablet Take 1 tablet (500 mg total) by mouth 2 (two) times daily. 03/25/20   Georgetta Haber, NP  ondansetron (ZOFRAN) 4 MG tablet Take 1 tablet (4 mg total) by mouth every 8 (eight) hours as needed for nausea or vomiting. 03/25/20   Georgetta Haber, NP  valACYclovir (VALTREX) 500 MG tablet Take by mouth. 03/01/15   [provider]    Family History No family history on file.  Social History Social History   Tobacco Use   Smoking status: Never Smoker   Smokeless tobacco: Never Used  Building services engineer Use: Never used  Substance Use Topics   Alcohol use: Never   Drug use: Never     Allergies   Oxycodone   Review of Systems Review of Systems  Constitutional: Positive for fatigue. Negative for chills, diaphoresis and fever.  HENT: Positive for congestion, rhinorrhea and sinus pressure. Negative for ear pain, sinus pain and sore throat.   Respiratory: Positive for cough. Negative for shortness of breath and wheezing.   Cardiovascular: Negative for chest pain and leg swelling.  Gastrointestinal: Negative for abdominal pain, nausea and vomiting.  Musculoskeletal: Negative for arthralgias and myalgias.  Skin: Negative for rash.  Neurological: Negative for weakness and headaches.  Hematological: Negative for adenopathy.  Psychiatric/Behavioral: Positive for sleep disturbance.     Physical Exam Triage Vital Signs ED Triage Vitals  Enc Vitals Group     BP 04/13/20 0814 103/73     Pulse Rate 04/13/20 0814 90     Resp 04/13/20 0814 18     Temp 04/13/20 0814 98.4 F (36.9 C)     Temp Source 04/13/20 0814 Oral     SpO2 04/13/20 0814 100 %     Weight 04/13/20 0816 128 lb 1.4 oz (58.1 kg)     Height 04/13/20 0816 5\' 6"  (1.676 m)     Head Circumference --      Peak Flow --      Pain Score 04/13/20 0815 2     Pain Loc --      Pain Edu? --       Excl. in GC? --    No data found.  Updated Vital Signs BP 103/73    Pulse 90    Temp 98.4 F (36.9 C) (Oral)    Resp 18    Ht 5\' 6"  (1.676 m)    Wt 128 lb 1.4 oz (58.1 kg)    SpO2 100%    BMI 20.67 kg/m      Physical Exam Vitals and nursing note reviewed.  Constitutional:      General: She is not in acute distress.    Appearance: Normal appearance. She is not ill-appearing or toxic-appearing.  HENT:     Head: Normocephalic and atraumatic.     Right Ear: Tympanic membrane, ear canal and external ear normal.     Left Ear: Tympanic membrane, ear canal and external ear normal.     Nose: Congestion present.     Mouth/Throat:     Mouth: Mucous membranes are moist.     Pharynx: Oropharynx is clear. No posterior oropharyngeal erythema.  Eyes:     General: No scleral icterus.       Right eye: No discharge.        Left eye: No discharge.     Conjunctiva/sclera: Conjunctivae normal.  Cardiovascular:     Rate and Rhythm: Normal rate and regular rhythm.     Heart sounds: Normal heart sounds.  Pulmonary:     Effort: Pulmonary effort is normal. No respiratory distress.     Breath sounds: Rhonchi (few scattered rhonchi of bilateral upper lungs that clear with cough) present. No wheezing or rales.  Musculoskeletal:     Cervical back: Neck supple.  Skin:    General: Skin is dry.  Neurological:     General: No focal deficit present.     Mental Status: She is alert. Mental status is at baseline.     Motor: No weakness.     Gait: Gait normal.  Psychiatric:        Mood and Affect: Mood normal.        Behavior: Behavior normal.        Thought Content: Thought content normal.      UC Treatments / Results  Labs (all labs ordered are listed, but only abnormal results are displayed) Labs Reviewed - No data to display  EKG   Radiology No results found.  Procedures Procedures (including critical care time)  Medications Ordered in UC Medications - No data to display  Initial  Impression / Assessment and Plan / UC Course  I have reviewed the triage vital signs and the nursing notes.  Pertinent labs & imaging results that  were available during my care of the patient were reviewed by me and considered in my medical decision making (see chart for details).   54 year old female with 2-week history of worsening productive cough.  All vital signs are stable.  She does have a few scattered rhonchi of the upper airway that seem to clear with cough.  Patient requesting something stronger for cough.  Prescribed Cheratussin for her.  Reviewed controlled substance database.  Advised increased rest and fluid intake.  Covid testing declined at this time.  Also treating with doxycycline since she says her symptoms are worsening.  Treating to cover for atypicals.  Advised patient to follow-up for any new or worsening symptoms or if not feeling better over the next 2 weeks.  ED precautions discussed.  Patient agreeable.   Final Clinical Impressions(s) / UC Diagnoses   Final diagnoses:  Acute bronchitis, unspecified organism  Cough     Discharge Instructions     BRONCHITIS: Take Mucinex D throughout the day and drink plenty of fluids to break up the mucus . Take cough syrup at bedtime if needed, for cough and to assist with sleep. Take Ibuprofen or other NSAID for relief of any pleuritic pain. Increase rest and fluid intake. Return to the clinic, your PCP, or ER if you have any new/ worsening symptoms such as fever,  chest pain, difficulty breathing, worsening cough, mental status changes, lethargy, etc.          ED Prescriptions    Medication Sig Dispense Auth. Provider   doxycycline (VIBRAMYCIN) 100 MG capsule Take 1 capsule (100 mg total) by mouth 2 (two) times daily for 7 days. 14 capsule Eusebio Friendly B, PA-C   guaiFENesin-codeine (CHERATUSSIN AC) 100-10 MG/5ML syrup Take 5 mLs by mouth 4 (four) times daily as needed for up to 7 days for cough. 120 mL Shirlee Latch, PA-C      PDMP not reviewed this encounter.   Shirlee Latch, PA-C 04/13/20 [phone]

## 2020-04-13 NOTE — ED Triage Notes (Signed)
Pt reports having a productive cough x2 weeks, runny nose and low grade fever. Reports having 2 neg covid test with the last one being on Monday. sts she has been taking OTC meds without relief.

## 2020-05-09 ENCOUNTER — Other Ambulatory Visit: Payer: Self-pay

## 2020-05-09 ENCOUNTER — Ambulatory Visit: Payer: BC Managed Care – PPO | Attending: Orthopedic Surgery | Admitting: Physical Therapy

## 2020-05-09 DIAGNOSIS — M6281 Muscle weakness (generalized): Secondary | ICD-10-CM | POA: Insufficient documentation

## 2020-05-09 DIAGNOSIS — R262 Difficulty in walking, not elsewhere classified: Secondary | ICD-10-CM

## 2020-05-09 DIAGNOSIS — M25672 Stiffness of left ankle, not elsewhere classified: Secondary | ICD-10-CM

## 2020-05-09 NOTE — Therapy (Signed)
[facility] [address] [place], [place], [address] Phone: [phone]   Fax:  [phone]  Physical Therapy Evaluation  Patient Details  Name: Caitlin Chandler MRN: [id number] Date of Birth: 06/16/1966 Referring Provider (PT): Cheryl Flash   Encounter Date: 05/09/2020   PT End of Session - 05/10/20 0818    Visit Number 1    Number of Visits 13    Date for PT Re-Evaluation 06/21/20    PT Start Time 1700    PT Stop Time 1810    PT Time Calculation (min) 70 min    Activity Tolerance Patient tolerated treatment well    Behavior During Therapy [facility] [address] for tasks assessed/performed           No past medical history on file.  Past Surgical History:  Procedure Laterality Date  . CHOLECYSTECTOMY    . SHOULDER SURGERY Left     There were no vitals filed for this visit.    Subjective Assessment - 05/09/20 1708    Subjective Patient presents to clinic 7 weeks s/p L fibular fx. In the past two weeks, patient notes she has felt she turned a corner. Patient had follow up with MD and was cleared to discontinue CAM boot and has transitioned to high ankle brace. Patient had Xrays that showed good healing that is not yet complete with L fibular fracture. Patient notes her pain increases with activity and is easily aggravated by bed mobility.    Pertinent History --    Currently in Pain? No/denies            OBJECTIVE  MUSCULOSKELETAL: Tremor: Absent Bulk: Normal Tone: Normal, no clonus Mild trophic changes noted to L foot/ankle/calf complex commensurate with stage of fracture healing. No significant ecchymosis, erythema, or edema noted. No gross ankle/foot deformity noted.  Posture L IC elevated in standing (commensurate with duration of CAM boot use).  Gait Limited L dorsiflexion throughout gait. Foot flat contact on L with decreased push off and hip extension.  Palpation No pain to palpation along medial and lateral  malleoli. No pain over posterior ankle. Mild discomfort in with light touch to dorsal surface of L foot.   Strength R/L 5/2 Ankle Plantarflexion 5/3- Ankle Dorsiflexion 5/2 Ankle Inversion 5/2 Ankle Eversion   AROM (non-WB) R/L (resting angle 120 B) 18 degrees/3 degrees Ankle Plantarflexion 28 degrees/15 degrees Ankle Dorsiflexion 35/10 Ankle Inversion 20/10 Ankle Eversion   Passive Accessory Motion Superior Tibiofibular Joint: WNL Inferior Tibiofibular Joint: not assessed 2/2 to incomplete healing of L fibula just superior to L lateral malleolus Talocrural Joint AP: hypomobile Talocrural Joint PA: hypomobile  NEUROLOGICAL:  Mental Status Patient is oriented to person, place and time.  Recent memory is intact.  Remote memory is intact.  Attention span and concentration are intact.  Expressive speech is intact.  Patient's fund of knowledge is within normal limits for educational level.  Sensation Grossly intact to light touch bilateral LEs as determined by testing dermatomes L2-S2 Proprioception and hot/cold testing deferred on this date  VASCULAR Dorsalis pedis and posterior tibial pulses are palpable   ASSESSMENT Patient is a 54 year old presenting to clinic with chief complaints of L ankle pain and stiffness s/p L fibular fracture proximal to the lateral malleolus. Upon examination, patient demonstrates deficits in L ankle ROM, LLE strength, gait, posture, and balance as evidenced by L ankle plantarflexion 3 degrees, L ankle dorsiflexion 15 degrees, L ankle inversion and eversion 10 degrees (all measured non-weightbearing  AROM from resting position of 120 degrees plantarflexion), gross L ankle strength 2+/5 MMT, L foot flat contact with negligible push off during gait, and elevated L IC. Patient's responses on FOTO outcome measures (43) indicate significant functional limitations/disability/distress. Patient's progress may be limited due to concerns for bone density loss;  however, patient's motivation and prior successful participation in PT is advantageous. Patient was able to tolerate gentle ROM exercises and joint mobilizations during today's evaluation and responded positively to active interventions. Patient will benefit from continued skilled therapeutic intervention to address deficits in L ankle ROM, LLE strength, gait, posture, and balance in order to increase function and improve overall QOL.   Objective measurements completed on examination: See above findings.    TREATMENT  Manual Therapy: L Talocrural joint mobilizations, AP and PA, grade I-II, bouts of 30 sec for improved joint mobility STM/TPR of L gastroc complex for improved mobility and decreased pain L metatarsal joint mobilizations, grade I-II, bouts of 30 sec for improved joint mobility  Neuromuscular Re-education: Seated towel inversion/eversion ROM, LLE, 2x 30 sec for graded exposure to movement and desensitization Seated towel scrunches, LLE, 2x 30 sec, for graded exposure to movement and desensitization Patient education on nerve re-education/desensitization for improved pain modulation and tolerance to activity.   EDUCATION Patient educated on prognosis, POC, and provided with HEP including: hip abduction, hip extension, towel scrunches, towel inversion/eversion. Patient articulated understanding and returned demonstration. Patient will benefit from further education in order to maximize compliance and understanding for long-term therapeutic gains.              PT Long Term Goals - 05/10/20 0913      PT LONG TERM GOAL #1   Title Patient will be independent with HEP in order to decrease ankle pain and increase strength in order to improve pain-free function at home and work.    Baseline IE: provided    Time 6    Period Weeks    Status New    Target Date 06/21/20      PT LONG TERM GOAL #2   Title Patient will demonstrate improved function as evidenced by a score of 70  on FOTO measure for full participation in activities at home and in the community.    Baseline IE: 43    Time 6    Period Weeks    Status New    Target Date 06/21/20      PT LONG TERM GOAL #3   Title Patient will improve L ankle ROM to within 3 degrees (SEM) of R ankle in all motions for full participation in yoga and other wellness activities.    Baseline IE: L ankle plantarflexion 3 degrees, L ankle dorsiflexion 15 degrees, L ankle inversion and eversion 10 degrees (all measured non-weightbearing AROM from resting position of 120 degrees plantarflexion)    Time 6    Period Weeks    Status New    Target Date 06/21/20      PT LONG TERM GOAL #4   Title Patient will increase strength of L ankle by at least 1/2 MMT grade in order to demonstrate improvement in strength and function.    Baseline IE: 2+/5    Time 6    Period Weeks    Status New    Target Date 06/21/20                  Plan - 05/10/20 0818    Clinical Impression Statement Patient is a 54 year  old presenting to clinic with chief complaints of L ankle pain and stiffness s/p L fibular fracture proximal to the lateral malleolus. Upon examination, patient demonstrates deficits in L ankle ROM, LLE strength, gait, posture, and balance as evidenced by L ankle plantarflexion 3 degrees, L ankle dorsiflexion 15 degrees, L ankle inversion and eversion 10 degrees (all measured non-weightbearing AROM from resting position of 120 degrees plantarflexion), gross L ankle strength 2+/5 MMT, L foot flat contact with negligible push off during gait, and elevated L IC. Patient's responses on FOTO outcome measures (43) indicate significant functional limitations/disability/distress. Patient's progress may be limited due to concerns for bone density loss; however, patient's motivation and prior successful participation in PT is advantageous. Patient was able to tolerate gentle ROM exercises and joint mobilizations during today's evaluation and  responded positively to active interventions. Patient will benefit from continued skilled therapeutic intervention to address deficits in L ankle ROM, LLE strength, gait, posture, and balance in order to increase function and improve overall QOL.    Personal Factors and Comorbidities Behavior Pattern;Fitness;Past/Current Experience;Social Background    Examination-Activity Limitations Bed Mobility;Locomotion Level;Stairs;Squat;Stand    Examination-Participation Restrictions Yard Work;Cleaning;Community Activity;Shop    Stability/Clinical Decision Making Stable/Uncomplicated    Clinical Decision Making Low    Rehab Potential Good    PT Frequency 2x / week    PT Duration 6 weeks    PT Treatment/Interventions Electrical Stimulation;Cryotherapy;Moist Heat;Balance training;Neuromuscular re-education;Patient/family education;Manual techniques;Orthotic Fit/Training;Dry needling;Taping;Passive range of motion;Joint Manipulations;Therapeutic activities;Therapeutic exercise;Stair training    PT Next Visit Plan manual; balance; AROM    PT Home Exercise Plan LLE towel scrunches; LLE towel inversion/eversion; sidelying L hip abduction; prone L hip extension    Consulted and Agree with Plan of Care Patient           Patient will benefit from skilled therapeutic intervention in order to improve the following deficits and impairments:  Abnormal gait, Decreased balance, Decreased endurance, Decreased mobility, Decreased strength, Decreased activity tolerance, Decreased range of motion, Pain, Postural dysfunction, Improper body mechanics, Difficulty walking, Hypomobility, Decreased coordination  Visit Diagnosis: Stiffness of left ankle, not elsewhere classified  Muscle weakness (generalized)  Difficulty in walking, not elsewhere classified     Problem List There are no problems to display for this patient.  Sheria Lang PT, DPT [phone]  05/10/2020, 9:22 AM  [facility] [address] [place], [place], [address] Phone: [phone]   Fax:  [phone]  Name: Caitlin Chandler MRN: [id number] Date of Birth: [birth year]

## 2020-05-11 ENCOUNTER — Other Ambulatory Visit: Payer: Self-pay

## 2020-05-11 ENCOUNTER — Ambulatory Visit: Payer: BC Managed Care – PPO | Admitting: Physical Therapy

## 2020-05-11 ENCOUNTER — Encounter: Payer: Self-pay | Admitting: Physical Therapy

## 2020-05-11 DIAGNOSIS — M25672 Stiffness of left ankle, not elsewhere classified: Secondary | ICD-10-CM | POA: Diagnosis not present

## 2020-05-11 DIAGNOSIS — R262 Difficulty in walking, not elsewhere classified: Secondary | ICD-10-CM

## 2020-05-11 DIAGNOSIS — M6281 Muscle weakness (generalized): Secondary | ICD-10-CM

## 2020-05-11 NOTE — Therapy (Signed)
[facility] [address] [place], [place], [address] Phone: [phone]   Fax:  [phone]  Physical Therapy Treatment  Patient Details  Name: Caitlin Chandler MRN: [id number] Date of Birth: [birth year] Referring Provider (PT): Cheryl Flash   Encounter Date: 05/11/2020   PT End of Session - 05/11/20 1827    Visit Number 2    Number of Visits 13    Date for PT Re-Evaluation 06/21/20    PT Start Time 1644    PT Stop Time 1810    PT Time Calculation (min) 86 min    Activity Tolerance Patient tolerated treatment well    Behavior During Therapy [facility] for tasks assessed/performed           History reviewed. No pertinent past medical history.  Past Surgical History:  Procedure Laterality Date  . CHOLECYSTECTOMY    . SHOULDER SURGERY Left     There were no vitals filed for this visit.   Subjective Assessment - 05/11/20 1646    Subjective Patient notes she has been doing her HEP and even tried some yoga. Patient notes she has felt good with yoga and noticed that she was able to plantarflex her foot more to lay prone. Patient does note with limited mobility she has had an increase in UI 2/2 to limited mobility.    Currently in Pain? Yes    Pain Score 2     Pain Location Ankle    Pain Orientation Left    Pain Descriptors / Indicators Throbbing          TREATMENT  Manual Therapy: STM and TPR performed to L calf complex and anterior tib to allow for decreased tension and pain and improved posture and function L Talocrural joint mobilizations, AP and PA, grade I-II, bouts of 30 sec for improved joint mobility L prxomial tib-fib mobilizations, AP and PA, grade II-III, bouts of 30 sec for improved joint mobility STM and TPR performed to L plantar fascia to allow for decreased tension and pain and improved mobility and function  Neuromuscular Re-education: Static Standing balance with fingertip support:   Normal BOS on airex  x1 min  Narrow BOS on airex x1 min  Tandem stance on firm surface 4 x20 sec bouts  Tandem stance on firm surface with knee bend x5 reps Patient educated extensively on typical bladder function and urge suppression tactics as well as strategies to reduce bladder irritation to limit frequency and urgency thereby decreasing risk of falls.  Therapeutic Exercise: PROM of L ankle: inversion, eversion, dorsiflexion, plantarflexion Plantarflexion stretch, 30 sec x4 Calf stretch, 30 sec x4 GTE stretch, 30 sec x4 Ankle circles, AROM, several reps  Patient educated throughout session on appropriate technique and form using multi-modal cueing, HEP, and activity modification. Patient articulated understanding and returned demonstration.  Patient Response to interventions: Notes increased mobility in ankle joint.  ASSESSMENT Patient presents to clinic with excellent motivation to participate in therapy. Patient demonstrates deficits in L ankle ROM, LLE strength, gait, posture, and balance. Patient able to achieve tandem stance with LLE rear with no noted unsteadiness during today's session and responded positively to active and educational interventions. Patient will benefit from continued skilled therapeutic intervention to address remaining deficits in L ankle ROM, LLE strength, gait, posture, and balance in order to increase function and improve overall QOL.     PT Long Term Goals - 05/10/20 0913      PT LONG TERM GOAL #1   Title  Patient will be independent with HEP in order to decrease ankle pain and increase strength in order to improve pain-free function at home and work.    Baseline IE: provided    Time 6    Period Weeks    Status New    Target Date 06/21/20      PT LONG TERM GOAL #2   Title Patient will demonstrate improved function as evidenced by a score of 70 on FOTO measure for full participation in activities at home and in the community.    Baseline IE: 43    Time 6    Period  Weeks    Status New    Target Date 06/21/20      PT LONG TERM GOAL #3   Title Patient will improve L ankle ROM to within 3 degrees (SEM) of R ankle in all motions for full participation in yoga and other wellness activities.    Baseline IE: L ankle plantarflexion 3 degrees, L ankle dorsiflexion 15 degrees, L ankle inversion and eversion 10 degrees (all measured non-weightbearing AROM from resting position of 120 degrees plantarflexion)    Time 6    Period Weeks    Status New    Target Date 06/21/20      PT LONG TERM GOAL #4   Title Patient will increase strength of L ankle by at least 1/2 MMT grade in order to demonstrate improvement in strength and function.    Baseline IE: 2+/5    Time 6    Period Weeks    Status New    Target Date 06/21/20                 Plan - 05/11/20 1828    Clinical Impression Statement Patient presents to clinic with excellent motivation to participate in therapy. Patient demonstrates deficits in L ankle ROM, LLE strength, gait, posture, and balance. Patient able to achieve tandem stance with LLE rear with no noted unsteadiness during today's session and responded positively to active and educational interventions. Patient will benefit from continued skilled therapeutic intervention to address remaining deficits in L ankle ROM, LLE strength, gait, posture, and balance in order to increase function and improve overall QOL.    Personal Factors and Comorbidities Behavior Pattern;Fitness;Past/Current Experience;Social Background    Examination-Activity Limitations Bed Mobility;Locomotion Level;Stairs;Squat;Stand    Examination-Participation Restrictions Yard Work;Cleaning;Community Activity;Shop    Stability/Clinical Decision Making Stable/Uncomplicated    Rehab Potential Good    PT Frequency 2x / week    PT Duration 6 weeks    PT Treatment/Interventions Electrical Stimulation;Cryotherapy;Moist Heat;Balance training;Neuromuscular re-education;Patient/family  education;Manual techniques;Orthotic Fit/Training;Dry needling;Taping;Passive range of motion;Joint Manipulations;Therapeutic activities;Therapeutic exercise;Stair training    PT Next Visit Plan manual; balance; AROM    PT Home Exercise Plan LLE towel scrunches; LLE towel inversion/eversion; sidelying L hip abduction; prone L hip extension    Consulted and Agree with Plan of Care Patient           Patient will benefit from skilled therapeutic intervention in order to improve the following deficits and impairments:  Abnormal gait, Decreased balance, Decreased endurance, Decreased mobility, Decreased strength, Decreased activity tolerance, Decreased range of motion, Pain, Postural dysfunction, Improper body mechanics, Difficulty walking, Hypomobility, Decreased coordination  Visit Diagnosis: Stiffness of left ankle, not elsewhere classified  Muscle weakness (generalized)  Difficulty in walking, not elsewhere classified     Problem List There are no problems to display for this patient.  Sheria Lang PT, DPT [phone]  05/11/2020, 6:37 PM  [facility] [address] [place], [place], [address] Phone: [phone]   Fax:  [phone]  Name: Jordi Kamm MRN: [id number] Date of Birth: Sep 07, 1965

## 2020-05-15 ENCOUNTER — Encounter: Payer: Self-pay | Admitting: Physical Therapy

## 2020-05-18 ENCOUNTER — Other Ambulatory Visit: Payer: Self-pay

## 2020-05-18 ENCOUNTER — Ambulatory Visit: Payer: BC Managed Care – PPO | Admitting: Physical Therapy

## 2020-05-18 ENCOUNTER — Encounter: Payer: Self-pay | Admitting: Physical Therapy

## 2020-05-18 DIAGNOSIS — M6281 Muscle weakness (generalized): Secondary | ICD-10-CM

## 2020-05-18 DIAGNOSIS — R262 Difficulty in walking, not elsewhere classified: Secondary | ICD-10-CM

## 2020-05-18 DIAGNOSIS — M25672 Stiffness of left ankle, not elsewhere classified: Secondary | ICD-10-CM | POA: Diagnosis not present

## 2020-05-18 NOTE — Therapy (Signed)
[facility] [address] [place], [place], [address] Phone: [phone]   Fax:  [phone]  Physical Therapy Treatment  Patient Details  Name: Caitlin Chandler MRN: [id number] Date of Birth: 05-14-1966 Referring Provider (PT): Cheryl Flash   Encounter Date: 05/18/2020   PT End of Session - 05/18/20 1720    Visit Number 3    Number of Visits 13    Date for PT Re-Evaluation 06/21/20    PT Start Time 1605    PT Stop Time 1705    PT Time Calculation (min) 60 min    Activity Tolerance Patient tolerated treatment well    Behavior During Therapy [facility] for tasks assessed/performed           History reviewed. No pertinent past medical history.  Past Surgical History:  Procedure Laterality Date   CHOLECYSTECTOMY     SHOULDER SURGERY Left     There were no vitals filed for this visit.   Subjective Assessment - 05/18/20 1607    Subjective Patient notes that she was excited to try a bodyweight squat and make it further into the squat. Patient denies any increased pain.    Currently in Pain? No/denies          TREATMENT  Manual Therapy: STM and TPR performed to L calf complex and anterior tib to allow for decreased tension and pain and improved posture and function L Talocrural joint mobilizations, AP and PA, grade I-II, bouts of 30 sec for improved joint mobility L prxomial tib-fib mobilizations, AP and PA, grade II-III, bouts of 30 sec for improved joint mobility STM and TPR performed to L plantar fascia to allow for decreased tension and pain and improved mobility and function  Therapeutic Exercise: PROM of L ankle: inversion, eversion, dorsiflexion, plantarflexion NuStep, L4, seat 8, x5 min for improved dorsiflexion. VCs and TCs throughout to limit plantarflexion Total [employer] squat to end range dorsiflexion, x20 6" Lateral step down to end range dorsiflexion, x10 6" Forward step down to end range dorsiflexion,  x9 BLE Heel Raises from 1" heel lift, BUE support, x15 BLE Heel Raises from 1" heel lift, BUE, eccentric lower LLE alone, x8 Eversion heel raises with ball black at ankle, x12 Gait in hallway with cueing for mechanics and improved midfoot rocker Calf stretch at step, 1 min, LLE  Patient educated throughout session on appropriate technique and form using multi-modal cueing, HEP, and activity modification. Patient articulated understanding and returned demonstration.  Patient Response to interventions: Notes "my ankle feels alive".  ASSESSMENT Patient presents to clinic with excellent motivation to participate in therapy. Patient demonstrates deficits in L ankle ROM, LLE strength, gait, posture, and balance. Patient able to perform increased load bearing activity with emphasis on improving dorsiflexion ROM during today's session and responded positively to active and manual interventions. Patient will benefit from continued skilled therapeutic intervention to address remaining deficits in L ankle ROM, LLE strength, gait, posture, and balance in order to increase function and improve overall QOL.        PT Long Term Goals - 05/10/20 0913      PT LONG TERM GOAL #1   Title Patient will be independent with HEP in order to decrease ankle pain and increase strength in order to improve pain-free function at home and work.    Baseline IE: provided    Time 6    Period Weeks    Status New    Target Date 06/21/20  PT LONG TERM GOAL #2   Title Patient will demonstrate improved function as evidenced by a score of 70 on FOTO measure for full participation in activities at home and in the community.    Baseline IE: 43    Time 6    Period Weeks    Status New    Target Date 06/21/20      PT LONG TERM GOAL #3   Title Patient will improve L ankle ROM to within 3 degrees (SEM) of R ankle in all motions for full participation in yoga and other wellness activities.    Baseline IE: L ankle  plantarflexion 3 degrees, L ankle dorsiflexion 15 degrees, L ankle inversion and eversion 10 degrees (all measured non-weightbearing AROM from resting position of 120 degrees plantarflexion)    Time 6    Period Weeks    Status New    Target Date 06/21/20      PT LONG TERM GOAL #4   Title Patient will increase strength of L ankle by at least 1/2 MMT grade in order to demonstrate improvement in strength and function.    Baseline IE: 2+/5    Time 6    Period Weeks    Status New    Target Date 06/21/20                 Plan - 05/18/20 1720    Clinical Impression Statement Patient presents to clinic with excellent motivation to participate in therapy. Patient demonstrates deficits in L ankle ROM, LLE strength, gait, posture, and balance. Patient able to perform increased load bearing activity with emphasis on improving dorsiflexion ROM during today's session and responded positively to active and manual interventions. Patient will benefit from continued skilled therapeutic intervention to address remaining deficits in L ankle ROM, LLE strength, gait, posture, and balance in order to increase function and improve overall QOL.    Personal Factors and Comorbidities Behavior Pattern;Fitness;Past/Current Experience;Social Background    Examination-Activity Limitations Bed Mobility;Locomotion Level;Stairs;Squat;Stand    Examination-Participation Restrictions Yard Work;Cleaning;Community Activity;Shop    Stability/Clinical Decision Making Stable/Uncomplicated    Rehab Potential Good    PT Frequency 2x / week    PT Duration 6 weeks    PT Treatment/Interventions Electrical Stimulation;Cryotherapy;Moist Heat;Balance training;Neuromuscular re-education;Patient/family education;Manual techniques;Orthotic Fit/Training;Dry needling;Taping;Passive range of motion;Joint Manipulations;Therapeutic activities;Therapeutic exercise;Stair training    PT Next Visit Plan manual; balance; AROM    PT Home Exercise  Plan LLE towel scrunches; LLE towel inversion/eversion; sidelying L hip abduction; prone L hip extension    Consulted and Agree with Plan of Care Patient           Patient will benefit from skilled therapeutic intervention in order to improve the following deficits and impairments:  Abnormal gait, Decreased balance, Decreased endurance, Decreased mobility, Decreased strength, Decreased activity tolerance, Decreased range of motion, Pain, Postural dysfunction, Improper body mechanics, Difficulty walking, Hypomobility, Decreased coordination  Visit Diagnosis: Stiffness of left ankle, not elsewhere classified  Muscle weakness (generalized)  Difficulty in walking, not elsewhere classified     Problem List There are no problems to display for this patient.  Sheria Lang PT, DPT [phone]  05/18/2020, 5:28 PM  [facility] [address] [place], [place], [address] Phone: [phone]   Fax:  [phone]  Name: Laurelin Elson MRN: [id number] Date of Birth: [birth year]

## 2020-05-22 ENCOUNTER — Encounter: Payer: Self-pay | Admitting: Physical Therapy

## 2020-05-22 ENCOUNTER — Ambulatory Visit: Payer: BC Managed Care – PPO | Admitting: Physical Therapy

## 2020-05-22 ENCOUNTER — Other Ambulatory Visit: Payer: Self-pay

## 2020-05-22 DIAGNOSIS — R262 Difficulty in walking, not elsewhere classified: Secondary | ICD-10-CM

## 2020-05-22 DIAGNOSIS — M25672 Stiffness of left ankle, not elsewhere classified: Secondary | ICD-10-CM

## 2020-05-22 DIAGNOSIS — M6281 Muscle weakness (generalized): Secondary | ICD-10-CM

## 2020-05-22 NOTE — Therapy (Signed)
[facility] [address] [place], [place], [address] Phone: [phone]   Fax:  [phone]  Physical Therapy Treatment  Patient Details  Name: Caitlin Chandler MRN: [id number] Date of Birth: [birth year] Referring Provider (PT): Cheryl Flash   Encounter Date: 05/22/2020   PT End of Session - 05/22/20 1715    Visit Number 4    Number of Visits 13    Date for PT Re-Evaluation 06/21/20    PT Start Time 1705    PT Stop Time 1800    PT Time Calculation (min) 55 min    Activity Tolerance Patient tolerated treatment well    Behavior During Therapy [facility] for tasks assessed/performed           History reviewed. No pertinent past medical history.  Past Surgical History:  Procedure Laterality Date  . CHOLECYSTECTOMY    . SHOULDER SURGERY Left     There were no vitals filed for this visit.   Subjective Assessment - 05/22/20 1711    Subjective Patient notes that she is feeling much more motion out of her ankle and was even able to put on UGG boots which she had not been able to do previously. Patient notes she also worked in some yoga work and all felt fine. Patient notes she did feel some pain in the lateral aspect of her lower L leg when in a wide stance doing an [facility] press.    Currently in Pain? No/denies           TREATMENT  Therapeutic Exercise: PROM of L ankle: inversion, eversion, dorsiflexion, plantarflexion NuStep, L4, seat 8, x10 min for improved dorsiflexion. VCs and TCs throughout to limit plantarflexion Chair squat to end range dorsiflexion, // bars for UE support, 2x15 6" Lateral step down to end range dorsiflexion, 2x10 BLE Heel Raises from 1" heel lift, BUE support, 2x10 BLE Heel Raises from 1" heel lift, BUE, eccentric lower LLE alone, 3x4 Gait in hallway with cueing for mechanics and improved midfoot rocker Gastroc stretch at step, 1 min, LLE Soleus stretch at step, 1 min, LLE Tandem stance, L rear, 2x1 min  hold SLS, L, x1 min hold Airex tandem stance, L rear, x1 min Airex SLS, L, x1 min hold Patient education on yoga modifications for home practice.  Patient educated throughout session on appropriate technique and form using multi-modal cueing, HEP, and activity modification. Patient articulated understanding and returned demonstration.  Patient Response to interventions: Denies increased pain.  ASSESSMENT Patient presents to clinic with excellent motivation to participate in therapy. Patient demonstrates deficits in L ankle ROM, LLE strength, gait, posture, and balance. Patient able to tolerate SLS on firm and compliant surface with goo stability and no increase in pain during today's session and responded positively to active interventions. Patient will benefit from continued skilled therapeutic intervention to address remaining deficits in L ankle ROM, LLE strength, gait, posture, and balance in order to increase function and improve overall QOL.       PT Long Term Goals - 05/10/20 0913      PT LONG TERM GOAL #1   Title Patient will be independent with HEP in order to decrease ankle pain and increase strength in order to improve pain-free function at home and work.    Baseline IE: provided    Time 6    Period Weeks    Status New    Target Date 06/21/20      PT LONG TERM GOAL #2  Title Patient will demonstrate improved function as evidenced by a score of 70 on FOTO measure for full participation in activities at home and in the community.    Baseline IE: 43    Time 6    Period Weeks    Status New    Target Date 06/21/20      PT LONG TERM GOAL #3   Title Patient will improve L ankle ROM to within 3 degrees (SEM) of R ankle in all motions for full participation in yoga and other wellness activities.    Baseline IE: L ankle plantarflexion 3 degrees, L ankle dorsiflexion 15 degrees, L ankle inversion and eversion 10 degrees (all measured non-weightbearing AROM from resting position  of 120 degrees plantarflexion)    Time 6    Period Weeks    Status New    Target Date 06/21/20      PT LONG TERM GOAL #4   Title Patient will increase strength of L ankle by at least 1/2 MMT grade in order to demonstrate improvement in strength and function.    Baseline IE: 2+/5    Time 6    Period Weeks    Status New    Target Date 06/21/20                 Plan - 05/22/20 1716    Clinical Impression Statement Patient presents to clinic with excellent motivation to participate in therapy. Patient demonstrates deficits in L ankle ROM, LLE strength, gait, posture, and balance. Patient able to tolerate SLS on firm and compliant surface with goo stability and no increase in pain during today's session and responded positively to active interventions. Patient will benefit from continued skilled therapeutic intervention to address remaining deficits in L ankle ROM, LLE strength, gait, posture, and balance in order to increase function and improve overall QOL.    Personal Factors and Comorbidities Behavior Pattern;Fitness;Past/Current Experience;Social Background    Examination-Activity Limitations Bed Mobility;Locomotion Level;Stairs;Squat;Stand    Examination-Participation Restrictions Yard Work;Cleaning;Community Activity;Shop    Stability/Clinical Decision Making Stable/Uncomplicated    Rehab Potential Good    PT Frequency 2x / week    PT Duration 6 weeks    PT Treatment/Interventions Electrical Stimulation;Cryotherapy;Moist Heat;Balance training;Neuromuscular re-education;Patient/family education;Manual techniques;Orthotic Fit/Training;Dry needling;Taping;Passive range of motion;Joint Manipulations;Therapeutic activities;Therapeutic exercise;Stair training    PT Next Visit Plan manual; balance; AROM    PT Home Exercise Plan LLE towel scrunches; LLE towel inversion/eversion; sidelying L hip abduction; prone L hip extension    Consulted and Agree with Plan of Care Patient            Patient will benefit from skilled therapeutic intervention in order to improve the following deficits and impairments:  Abnormal gait, Decreased balance, Decreased endurance, Decreased mobility, Decreased strength, Decreased activity tolerance, Decreased range of motion, Pain, Postural dysfunction, Improper body mechanics, Difficulty walking, Hypomobility, Decreased coordination  Visit Diagnosis: Stiffness of left ankle, not elsewhere classified  Muscle weakness (generalized)  Difficulty in walking, not elsewhere classified     Problem List There are no problems to display for this patient.  Sheria Lang PT, DPT [phone]  05/22/2020, 6:04 PM  [facility] [address] [place], [place], [address] Phone: [phone]   Fax:  [phone]  Name: Caitlin Chandler MRN: [id number] Date of Birth: 02-14-1966

## 2020-05-23 ENCOUNTER — Ambulatory Visit: Payer: BC Managed Care – PPO | Admitting: Physical Therapy

## 2020-05-25 ENCOUNTER — Other Ambulatory Visit: Payer: Self-pay

## 2020-05-25 ENCOUNTER — Ambulatory Visit: Payer: BC Managed Care – PPO | Admitting: Physical Therapy

## 2020-05-29 ENCOUNTER — Encounter: Payer: Self-pay | Admitting: Physical Therapy

## 2020-05-29 ENCOUNTER — Other Ambulatory Visit: Payer: Self-pay

## 2020-05-29 ENCOUNTER — Ambulatory Visit: Payer: BC Managed Care – PPO | Admitting: Physical Therapy

## 2020-05-29 DIAGNOSIS — M6281 Muscle weakness (generalized): Secondary | ICD-10-CM

## 2020-05-29 DIAGNOSIS — R262 Difficulty in walking, not elsewhere classified: Secondary | ICD-10-CM

## 2020-05-29 DIAGNOSIS — M25672 Stiffness of left ankle, not elsewhere classified: Secondary | ICD-10-CM | POA: Diagnosis not present

## 2020-05-29 NOTE — Therapy (Addendum)
[facility] [address] [place], [place], [address] Phone: [phone]   Fax:  [phone]  Physical Therapy Treatment  Patient Details  Name: Caitlin Chandler MRN: [id number] Date of Birth: 12/09/1965 Referring Provider (PT): Cheryl Flash   Encounter Date: 05/29/2020   PT End of Session - 05/29/20 1710    Visit Number 5    Number of Visits 13    Date for PT Re-Evaluation 06/21/20    PT Start Time 1700    PT Stop Time 1755    PT Time Calculation (min) 55 min    Activity Tolerance Patient tolerated treatment well    Behavior During Therapy [facility] for tasks assessed/performed           History reviewed. No pertinent past medical history.  Past Surgical History:  Procedure Laterality Date  . CHOLECYSTECTOMY    . SHOULDER SURGERY Left     There were no vitals filed for this visit.   Subjective Assessment - 05/29/20 1706    Subjective Patient notes that she feels she has hit a plateau with her progress. She notes that she was fairly sore and discoloured after last session. She was able to return to a workout with squats and deadlifts without issue.    Currently in Pain? No/denies           TREATMENT  Therapeutic Exercise: NuStep, L4, seat 8, x10 min for improved dorsiflexion. VCs and TCs throughout to limit plantarflexion Chair squat to end range dorsiflexion, // bars for UE support, 2x15 6" Lateral step down to end range dorsiflexion, 3x10, BLE BLE Heel Raises from 1" heel lift, BUE support, 3x10 BLE Heel Raises from 1" heel lift, BUE, eccentric lower LLE alone, 3x6 Gastroc stretch at step, 1 min, BLE Soleus stretch at step, 1 min, BLE Anterior tib stretch, 1 min, LLE SLS, L, 4x30s hold Airex SLS, L, 4x30s hold   Patient educated throughout session on appropriate technique and form using multi-modal cueing, HEP, and activity modification. Patient articulated understanding and returned demonstration.  Patient  Response to interventions: Denies increased pain.  ASSESSMENT Patient presents to clinic with excellent motivation to participate in therapy. Patient demonstrates deficits in L ankle ROM, LLE strength, gait, posture, and balance. Patient with improved L ankle stability on compliant surface during today's session and responded positively to active interventions. Patient will benefit from continued skilled therapeutic intervention to address remaining deficits in L ankle ROM, LLE strength, gait, posture, and balance in order to increase function and improve overall QOL.     PT Long Term Goals - 05/10/20 0913      PT LONG TERM GOAL #1   Title Patient will be independent with HEP in order to decrease ankle pain and increase strength in order to improve pain-free function at home and work.    Baseline IE: provided    Time 6    Period Weeks    Status New    Target Date 06/21/20      PT LONG TERM GOAL #2   Title Patient will demonstrate improved function as evidenced by a score of 70 on FOTO measure for full participation in activities at home and in the community.    Baseline IE: 43    Time 6    Period Weeks    Status New    Target Date 06/21/20      PT LONG TERM GOAL #3   Title Patient will improve L ankle ROM to within  3 degrees (SEM) of R ankle in all motions for full participation in yoga and other wellness activities.    Baseline IE: L ankle plantarflexion 3 degrees, L ankle dorsiflexion 15 degrees, L ankle inversion and eversion 10 degrees (all measured non-weightbearing AROM from resting position of 120 degrees plantarflexion)    Time 6    Period Weeks    Status New    Target Date 06/21/20      PT LONG TERM GOAL #4   Title Patient will increase strength of L ankle by at least 1/2 MMT grade in order to demonstrate improvement in strength and function.    Baseline IE: 2+/5    Time 6    Period Weeks    Status New    Target Date 06/21/20                 Plan - 05/29/20  1710    Clinical Impression Statement Patient presents to clinic with excellent motivation to participate in therapy. Patient demonstrates deficits in L ankle ROM, LLE strength, gait, posture, and balance. Patient with improved L ankle stability on compliant surface during today's session and responded positively to active interventions. Patient will benefit from continued skilled therapeutic intervention to address remaining deficits in L ankle ROM, LLE strength, gait, posture, and balance in order to increase function and improve overall QOL.    Personal Factors and Comorbidities Behavior Pattern;Fitness;Past/Current Experience;Social Background    Examination-Activity Limitations Bed Mobility;Locomotion Level;Stairs;Squat;Stand    Examination-Participation Restrictions Yard Work;Cleaning;Community Activity;Shop    Stability/Clinical Decision Making Stable/Uncomplicated    Rehab Potential Good    PT Frequency 2x / week    PT Duration 6 weeks    PT Treatment/Interventions Electrical Stimulation;Cryotherapy;Moist Heat;Balance training;Neuromuscular re-education;Patient/family education;Manual techniques;Orthotic Fit/Training;Dry needling;Taping;Passive range of motion;Joint Manipulations;Therapeutic activities;Therapeutic exercise;Stair training    PT Next Visit Plan manual; balance; AROM    PT Home Exercise Plan LLE towel scrunches; LLE towel inversion/eversion; sidelying L hip abduction; prone L hip extension    Consulted and Agree with Plan of Care Patient           Patient will benefit from skilled therapeutic intervention in order to improve the following deficits and impairments:  Abnormal gait, Decreased balance, Decreased endurance, Decreased mobility, Decreased strength, Decreased activity tolerance, Decreased range of motion, Pain, Postural dysfunction, Improper body mechanics, Difficulty walking, Hypomobility, Decreased coordination  Visit Diagnosis: Stiffness of left ankle, not  elsewhere classified  Muscle weakness (generalized)  Difficulty in walking, not elsewhere classified     Problem List There are no problems to display for this patient.  Sheria Lang PT, DPT [phone]  05/29/2020, 6:07 PM  [facility] [address] [place], [place], [address] Phone: [phone]   Fax:  [phone]  Name: Armilda Vanderlinden MRN: [id number] Date of Birth: 04-11-1966

## 2020-06-05 ENCOUNTER — Ambulatory Visit: Payer: BC Managed Care – PPO | Admitting: Physical Therapy

## 2020-06-05 ENCOUNTER — Encounter: Payer: Self-pay | Admitting: Physical Therapy

## 2020-06-05 ENCOUNTER — Other Ambulatory Visit: Payer: Self-pay

## 2020-06-05 DIAGNOSIS — M25672 Stiffness of left ankle, not elsewhere classified: Secondary | ICD-10-CM | POA: Diagnosis not present

## 2020-06-05 DIAGNOSIS — M6281 Muscle weakness (generalized): Secondary | ICD-10-CM

## 2020-06-05 DIAGNOSIS — R262 Difficulty in walking, not elsewhere classified: Secondary | ICD-10-CM

## 2020-06-05 NOTE — Therapy (Signed)
[facility] [address] [place], [place], [address] Phone: [phone]   Fax:  [phone]  Physical Therapy Treatment  Patient Details  Name: Caitlin Chandler MRN: [id number] Date of Birth: 01/08/1966 Referring Provider (PT): Cheryl Flash   Encounter Date: 06/05/2020   PT End of Session - 06/05/20 1700    Visit Number 6    Number of Visits 13    Date for PT Re-Evaluation 06/21/20    PT Start Time 1655    PT Stop Time 1750    PT Time Calculation (min) 55 min    Activity Tolerance Patient tolerated treatment well    Behavior During Therapy [facility] for tasks assessed/performed           History reviewed. No pertinent past medical history.  Past Surgical History:  Procedure Laterality Date  . CHOLECYSTECTOMY    . SHOULDER SURGERY Left     There were no vitals filed for this visit.   Subjective Assessment - 06/05/20 1657    Subjective Patient notes that after standing for Thanksgiving meal prep, she had increased pain and swelling. She describes this as nerve pain. Patient notes that she does feel better able to go up stairs as well as laying on his stomach.    Currently in Pain? No/denies           TREATMENT  Therapeutic Exercise: PROM of L ankle: inversion, eversion, dorsiflexion, plantarflexion NuStep, L4, seat 7, x10 min for improved dorsiflexion. VCs and TCs throughout to limit plantarflexion Chair squat to end range dorsiflexion, // bars for UE support, 2x15 with posterior talocrural mobilizations 6" Lateral step down to end range dorsiflexion, 2x10 6" forward step down (L foot rear), 2x10 with posterior talocrural mobilization BLE Heel Raises from 1" heel lift, BUE support, 2x10 BLE Heel Raises from 1" heel lift, BUE, eccentric lower LLE alone, x15 Gait in hallway with cueing for mechanics and improved midfoot rocker Gastroc stretch at step, 1 min, LLE Soleus stretch at step, 1 min, LLE Airex SLS, L, 3x1  min hold Patient education on using yoga strap for self-mobilization of L talocrural joint.  Patient educated throughout session on appropriate technique and form using multi-modal cueing, HEP, and activity modification. Patient articulated understanding and returned demonstration.  Patient Response to interventions: Notes improved mobility  ASSESSMENT Patient presents to clinic with excellent motivation to participate in therapy. Patient demonstrates deficits in L ankle ROM, LLE strength, gait, posture, and balance. Patient with increased L ankle dorsiflexion with addition of L talocrural joint mobilizations with movement during today's session and responded positively to active interventions. Patient will benefit from continued skilled therapeutic intervention to address remaining deficits in L ankle ROM, LLE strength, gait, posture, and balance in order to increase function and improve overall QOL.     PT Long Term Goals - 05/10/20 0913      PT LONG TERM GOAL #1   Title Patient will be independent with HEP in order to decrease ankle pain and increase strength in order to improve pain-free function at home and work.    Baseline IE: provided    Time 6    Period Weeks    Status New    Target Date 06/21/20      PT LONG TERM GOAL #2   Title Patient will demonstrate improved function as evidenced by a score of 70 on FOTO measure for full participation in activities at home and in the community.    Baseline IE:  43    Time 6    Period Weeks    Status New    Target Date 06/21/20      PT LONG TERM GOAL #3   Title Patient will improve L ankle ROM to within 3 degrees (SEM) of R ankle in all motions for full participation in yoga and other wellness activities.    Baseline IE: L ankle plantarflexion 3 degrees, L ankle dorsiflexion 15 degrees, L ankle inversion and eversion 10 degrees (all measured non-weightbearing AROM from resting position of 120 degrees plantarflexion)    Time 6    Period  Weeks    Status New    Target Date 06/21/20      PT LONG TERM GOAL #4   Title Patient will increase strength of L ankle by at least 1/2 MMT grade in order to demonstrate improvement in strength and function.    Baseline IE: 2+/5    Time 6    Period Weeks    Status New    Target Date 06/21/20                 Plan - 06/05/20 1701    Clinical Impression Statement Patient presents to clinic with excellent motivation to participate in therapy. Patient demonstrates deficits in L ankle ROM, LLE strength, gait, posture, and balance. Patient with increased L ankle dorsiflexion with addition of L talocrural joint mobilizations with movement during today's session and responded positively to active interventions. Patient will benefit from continued skilled therapeutic intervention to address remaining deficits in L ankle ROM, LLE strength, gait, posture, and balance in order to increase function and improve overall QOL.    Personal Factors and Comorbidities Behavior Pattern;Fitness;Past/Current Experience;Social Background    Examination-Activity Limitations Bed Mobility;Locomotion Level;Stairs;Squat;Stand    Examination-Participation Restrictions Yard Work;Cleaning;Community Activity;Shop    Stability/Clinical Decision Making Stable/Uncomplicated    Rehab Potential Good    PT Frequency 2x / week    PT Duration 6 weeks    PT Treatment/Interventions Electrical Stimulation;Cryotherapy;Moist Heat;Balance training;Neuromuscular re-education;Patient/family education;Manual techniques;Orthotic Fit/Training;Dry needling;Taping;Passive range of motion;Joint Manipulations;Therapeutic activities;Therapeutic exercise;Stair training    PT Next Visit Plan manual; balance; AROM    PT Home Exercise Plan LLE towel scrunches; LLE towel inversion/eversion; sidelying L hip abduction; prone L hip extension    Consulted and Agree with Plan of Care Patient           Patient will benefit from skilled  therapeutic intervention in order to improve the following deficits and impairments:  Abnormal gait, Decreased balance, Decreased endurance, Decreased mobility, Decreased strength, Decreased activity tolerance, Decreased range of motion, Pain, Postural dysfunction, Improper body mechanics, Difficulty walking, Hypomobility, Decreased coordination  Visit Diagnosis: Stiffness of left ankle, not elsewhere classified  Muscle weakness (generalized)  Difficulty in walking, not elsewhere classified     Problem List There are no problems to display for this patient.  Sheria Lang PT, DPT [phone]  06/06/2020, 9:42 AM  [facility] [address] [place], [place], [address] Phone: [phone]   Fax:  [phone]  Name: Caitlin Chandler MRN: [id number] Date of Birth: 09/28/1965

## 2020-06-08 ENCOUNTER — Ambulatory Visit: Payer: BC Managed Care – PPO | Attending: Orthopaedic Surgery | Admitting: Physical Therapy

## 2020-06-08 ENCOUNTER — Other Ambulatory Visit: Payer: Self-pay

## 2020-06-08 ENCOUNTER — Encounter: Payer: Self-pay | Admitting: Physical Therapy

## 2020-06-08 DIAGNOSIS — M6281 Muscle weakness (generalized): Secondary | ICD-10-CM

## 2020-06-08 DIAGNOSIS — M25672 Stiffness of left ankle, not elsewhere classified: Secondary | ICD-10-CM

## 2020-06-08 DIAGNOSIS — R262 Difficulty in walking, not elsewhere classified: Secondary | ICD-10-CM

## 2020-06-08 NOTE — Therapy (Signed)
[facility] [address] [place], [place], [address] Phone: [phone]   Fax:  [phone]  Patient Details  Name: Caitlin Chandler MRN: [id number] Date of Birth: 1966/02/07 Referring Provider:  Karis Juba, MD  Encounter Date: 06/08/2020   Subjective Assessment - 06/08/20 1730    Subjective Patient arrives to clinic and notes inability to pay for session with transition in insurance. Patient interested in HEP for self-management and discharge 2/2 to financial reasons.          Patient provided with comprehensive HEP including:  Supine Bridge with Foot Rolls - 1 x daily - 7 x weekly - 3 sets - 12 reps Alternating Single Leg Bridge - 1 x daily - 7 x weekly - 3 sets - 12 reps Standing Eccentric Heel Raise - 1 x daily - 7 x weekly - 3 sets - 8 reps Standing Heel Raise with Toes Turned Out - 1 x daily - 7 x weekly - 3 sets - 15 reps Standing Heel Raise with Toes Turned In - 1 x daily - 7 x weekly - 3 sets - 15 reps Gastroc Heel Raise in Stride Stance Position - 1 x daily - 7 x weekly - 1-2 min hold Soleus Heel Raise in Stride Stance Position - 1 x daily - 7 x weekly - 1-2 min hold  Sheria Lang PT, [place] #[id number]  06/08/2020, 5:34 PM   [facility] [address] [place], [place], [address] Phone: [phone]   Fax:  [phone]

## 2020-06-12 ENCOUNTER — Ambulatory Visit: Payer: BC Managed Care – PPO | Admitting: Physical Therapy

## 2020-06-15 ENCOUNTER — Encounter: Payer: BC Managed Care – PPO | Admitting: Physical Therapy

## 2020-06-19 ENCOUNTER — Encounter: Payer: BC Managed Care – PPO | Admitting: Physical Therapy

## 2020-06-22 ENCOUNTER — Encounter: Payer: BC Managed Care – PPO | Admitting: Physical Therapy

## 2020-06-26 ENCOUNTER — Encounter: Payer: BC Managed Care – PPO | Admitting: Physical Therapy

## 2020-06-29 ENCOUNTER — Encounter: Payer: BC Managed Care – PPO | Admitting: Physical Therapy

## 2020-07-03 ENCOUNTER — Encounter: Payer: BC Managed Care – PPO | Admitting: Physical Therapy

## 2020-07-06 ENCOUNTER — Encounter: Payer: BC Managed Care – PPO | Admitting: Physical Therapy

## 2020-07-10 ENCOUNTER — Other Ambulatory Visit: Payer: Self-pay

## 2020-07-10 ENCOUNTER — Ambulatory Visit (INDEPENDENT_AMBULATORY_CARE_PROVIDER_SITE_OTHER): Payer: BC Managed Care – PPO

## 2020-07-10 ENCOUNTER — Ambulatory Visit
Admission: EM | Admit: 2020-07-10 | Discharge: 2020-07-10 | Disposition: A | Discharge status: Home / Self Care | Payer: BC Managed Care – PPO

## 2020-07-10 DIAGNOSIS — J209 Acute bronchitis, unspecified: Secondary | ICD-10-CM | POA: Diagnosis not present

## 2020-07-10 DIAGNOSIS — U071 COVID-19: Secondary | ICD-10-CM | POA: Diagnosis not present

## 2020-07-10 DIAGNOSIS — R059 Cough, unspecified: Secondary | ICD-10-CM

## 2020-07-10 DIAGNOSIS — Z8616 Personal history of COVID-19: Secondary | ICD-10-CM | POA: Diagnosis not present

## 2020-07-10 DIAGNOSIS — R0602 Shortness of breath: Secondary | ICD-10-CM | POA: Diagnosis not present

## 2020-07-10 MED ORDER — ALBUTEROL SULFATE HFA 108 (90 BASE) MCG/ACT IN AERS: 1.0000 | INHALATION_SPRAY | RESPIRATORY_TRACT | 0 refills | Status: DC | PRN

## 2020-07-10 MED ORDER — GUAIFENESIN-CODEINE 100-10 MG/5ML PO SYRP: 5.0000 mL | ORAL_SOLUTION | Freq: Four times a day (QID) | ORAL | 0 refills | Status: AC | PRN

## 2020-07-10 MED ORDER — PREDNISONE 20 MG PO TABS: 40.0000 mg | ORAL_TABLET | Freq: Every day | ORAL | 0 refills | Status: AC

## 2020-07-10 NOTE — ED Triage Notes (Addendum)
Pt states she had COVID on 12/21, symptoms improved; now with worsening productive cough with mild SOB and with green sputum, wheezing.  Reports intermittent subjective fever over the past several days. Also c/o ear pressure and "muffled" sensation, sore throat for past three days.   Did telemed visit two days ago and doctor recommended CXR.  Denies n/v/d. Has been taking dayquil/nyquil-last taken this morning.

## 2020-07-10 NOTE — Discharge Instructions (Signed)
Your chest x-ray is normal today. There is no evidence of pneumonia. I do suspect you probably have bronchitis. Start the redness on and use the inhaler as needed for any shortness of breath. I have also printed a prescription for cough medication that you can use since the over-the-counter ones are not helping that much. Increase your rest and fluid intake. Follow-up with [place] if you develop a fever, chest pain, or increased breathing difficulty. Antibiotics not indicated this time.

## 2020-07-10 NOTE — ED Provider Notes (Signed)
[facility]    CSN: [id number] Arrival date & time: 07/10/20  1623      History   Chief Complaint Chief Complaint  Patient presents with   Cough    HPI Caitlin Chandler is a 55 y.o. female presenting for productive cough with mild shortness of breath and some wheezing over the past couple of days.  Patient states that she was diagnosed with COVID-19 around Christmas 2021 and had onset of symptoms 06/27/20. Patient is also complaining of ear pressure and a muffled sensation as well as sore throat over the past 3 days.  Patient states she saw a telemedicine provider couple days ago and they recommended she get a chest x-ray.  She has been taking over-the-counter DayQuil and NyQuil but says they have not really helped the cough.  Patient denies fever, weakness, chest pain, abdominal pain, N/V/D.  Patient denies any significant medical history for cardiopulmonary disease.  No other complaints or concerns today.  HPI  History reviewed. No pertinent past medical history.  There are no problems to display for this patient.   Past Surgical History:  Procedure Laterality Date   CHOLECYSTECTOMY     SHOULDER SURGERY Left     OB History   No obstetric history on file.      Home Medications    Prior to Admission medications   Medication Sig Start Date End Date Taking? Authorizing Provider  albuterol (VENTOLIN HFA) 108 (90 Base) MCG/ACT inhaler Inhale 1-2 puffs into the lungs every 4 (four) hours as needed for up to 10 days for wheezing or shortness of breath. 07/10/20 07/20/20 Yes Danton Clap, PA-C  guaiFENesin-codeine (ROBITUSSIN AC) 100-10 MG/5ML syrup Take 5 mLs by mouth 4 (four) times daily as needed for up to 7 days for cough. 07/10/20 07/17/20 Yes Laurene Footman B, PA-C  MYRBETRIQ 50 MG TB24 tablet Take 50 mg by mouth daily. 02/28/20  Yes [provider]  predniSONE (DELTASONE) 20 MG tablet Take 2 tablets (40 mg total) by mouth daily for 5 days. 07/10/20  07/15/20 Yes Danton Clap, PA-C  valACYclovir (VALTREX) 500 MG tablet Take by mouth. 03/01/15  Yes [provider]  Cholecalciferol 1.25 MG (50000 UT) capsule Take by mouth.    [provider]  cyclobenzaprine (FLEXERIL) 10 MG tablet Take 1 tablet (10 mg total) by mouth at bedtime. 03/10/18   Norval Gable, MD  levonorgestrel (LILETTA, 52 MG,) 19.5 MCG/DAY IUD IUD Liletta 20.1 mcg/24 hrs (6 yrs) 52 mg intrauterine device  Take 1 device by intrauterine route.    [provider]  naproxen (NAPROSYN) 500 MG tablet Take 1 tablet (500 mg total) by mouth 2 (two) times daily. 03/25/20   Zigmund Gottron, NP  ondansetron (ZOFRAN) 4 MG tablet Take 1 tablet (4 mg total) by mouth every 8 (eight) hours as needed for nausea or vomiting. 03/25/20   Zigmund Gottron, NP    Family History History reviewed. No pertinent family history.  Social History Social History   Tobacco Use   Smoking status: Never Smoker   Smokeless tobacco: Never Used  Scientific laboratory technician Use: Never used  Substance Use Topics   Alcohol use: Never   Drug use: Never     Allergies   Oxycodone   Review of Systems Review of Systems  Constitutional: Positive for fatigue. Negative for chills, diaphoresis and fever.  HENT: Positive for congestion and ear pain. Negative for rhinorrhea, sinus pressure, sinus pain and sore throat.  Respiratory: Positive for cough, shortness of breath and wheezing. Negative for stridor.   Cardiovascular: Negative for chest pain.  Gastrointestinal: Negative for abdominal pain, nausea and vomiting.  Musculoskeletal: Negative for arthralgias and myalgias.  Skin: Negative for rash.  Neurological: Negative for weakness and headaches.  Hematological: Negative for adenopathy.     Physical Exam Triage Vital Signs ED Triage Vitals  Enc Vitals Group     BP 07/10/20 1831 118/70     Pulse Rate 07/10/20 1831 82     Resp 07/10/20 1831 18     Temp 07/10/20 1831 98.2 F  (36.8 C)     Temp Source 07/10/20 1831 Oral     SpO2 07/10/20 1831 99 %     Weight 07/10/20 1715 128 lb (58.1 kg)     Height 07/10/20 1715 5\' 6"  (1.676 m)     Head Circumference --      Peak Flow --      Pain Score 07/10/20 1715 0     Pain Loc --      Pain Edu? --      Excl. in GC? --    No data found.  Updated Vital Signs BP 118/70 (BP Location: Right Arm)    Pulse 82    Temp 98.2 F (36.8 C) (Oral)    Resp 18    Ht 5\' 6"  (1.676 m)    Wt 128 lb (58.1 kg)    SpO2 99%    BMI 20.66 kg/m       Physical Exam Vitals and nursing note reviewed.  Constitutional:      General: She is not in acute distress.    Appearance: Normal appearance. She is not ill-appearing or toxic-appearing.  HENT:     Head: Normocephalic and atraumatic.     Right Ear: Tympanic membrane, ear canal and external ear normal.     Left Ear: Tympanic membrane, ear canal and external ear normal.     Nose: Congestion and rhinorrhea present.     Mouth/Throat:     Mouth: Mucous membranes are moist.     Pharynx: Oropharynx is clear.  Eyes:     General: No scleral icterus.       Right eye: No discharge.        Left eye: No discharge.     Conjunctiva/sclera: Conjunctivae normal.  Cardiovascular:     Rate and Rhythm: Normal rate and regular rhythm.     Heart sounds: Normal heart sounds.  Pulmonary:     Effort: Pulmonary effort is normal. No respiratory distress.     Breath sounds: Normal breath sounds. No wheezing, rhonchi or rales.  Musculoskeletal:     Cervical back: Neck supple.  Skin:    General: Skin is dry.  Neurological:     General: No focal deficit present.     Mental Status: She is alert. Mental status is at baseline.     Motor: No weakness.     Gait: Gait normal.  Psychiatric:        Mood and Affect: Mood normal.        Behavior: Behavior normal.        Thought Content: Thought content normal.      UC Treatments / Results  Labs (all labs ordered are listed, but only abnormal results are  displayed) Labs Reviewed - No data to display  EKG   Radiology DG Chest 2 View  Result Date: 07/10/2020 CLINICAL DATA:  55 year old female with positive COVID-19. Shortness of  breath and cough. EXAM: CHEST - 2 VIEW COMPARISON:  None. FINDINGS: The heart size and mediastinal contours are within normal limits. Both lungs are clear. The visualized skeletal structures are unremarkable. IMPRESSION: No active cardiopulmonary disease. Electronically Signed   By: Elgie Collard M.D.   On: 07/10/2020 18:49    Procedures Procedures (including critical care time)  Medications Ordered in UC Medications - No data to display  Initial Impression / Assessment and Plan / UC Course  I have reviewed the triage vital signs and the nursing notes.  Pertinent labs & imaging results that were available during my care of the patient were reviewed by me and considered in my medical decision making (see chart for details).   Chest x-ray is within normal limits.  I suspect patient probably has bronchitis secondary to COVID-19.  Advised increasing rest and fluid intake.  Sent prednisone and an albuterol inhaler.  Also prescribed Cheratussin for her cough since over-the-counter medications are not working.  Reviewed controlled substance database and patient low risk for abuse.  ED precautions reviewed with patient.   Final Clinical Impressions(s) / UC Diagnoses   Final diagnoses:  Acute bronchitis, unspecified organism  Personal history of COVID-19  Cough     Discharge Instructions     Your chest x-ray is normal today. There is no evidence of pneumonia. I do suspect you probably have bronchitis. Start the redness on and use the inhaler as needed for any shortness of breath. I have also printed a prescription for cough medication that you can use since the over-the-counter ones are not helping that much. Increase your rest and fluid intake. Follow-up with [place] if you develop a fever, chest pain, or increased  breathing difficulty. Antibiotics not indicated this time.    ED Prescriptions    Medication Sig Dispense Auth. Provider   predniSONE (DELTASONE) 20 MG tablet Take 2 tablets (40 mg total) by mouth daily for 5 days. 10 tablet Eusebio Friendly B, PA-C   albuterol (VENTOLIN HFA) 108 (90 Base) MCG/ACT inhaler Inhale 1-2 puffs into the lungs every 4 (four) hours as needed for up to 10 days for wheezing or shortness of breath. 1 g Eusebio Friendly B, PA-C   guaiFENesin-codeine (ROBITUSSIN AC) 100-10 MG/5ML syrup Take 5 mLs by mouth 4 (four) times daily as needed for up to 7 days for cough. 120 mL Shirlee Latch, PA-C     PDMP not reviewed this encounter.   Shirlee Latch, PA-C 07/11/20 [phone]

## 2020-08-09 ENCOUNTER — Other Ambulatory Visit: Payer: Self-pay | Admitting: Family Medicine

## 2020-08-09 DIAGNOSIS — M810 Age-related osteoporosis without current pathological fracture: Secondary | ICD-10-CM

## 2020-11-30 NOTE — Progress Notes (Signed)
I, Philbert Riser, LAT, ATC acting as a scribe for Clementeen Graham, MD.  Subjective:    CC: Left ankle pain/stiffness  HPI: Pt is a 55 y/o female c/o L ankle pain and stiffness. MOI: Pt suffered a fracture of the L distal fibula fx from playing around, misstepped forward, causing her to roll ankle, over supination on 03/25/20. Pt wasseen at [facility] following the injury and completed 7 total PT sessions. Today, pt reports that she is having pain, mobility issues, and swelling. Locates it all the way around the ankle but concentrated on the left side. Cannot wear heals for long, even sandals will cause swelling. Patient does yoga and does not have the flexibility in that foot that she used to. Walking is fine but by the end of the day there would be pain and swelling even with a compression sleeve.  Dx imaging: 03/25/20 L ankle XR  Pertinent review of Systems: No fevers or chills  Relevant historical information: Celiac disease   Objective:    Vitals:   12/01/20 1024  BP: 100/60  Pulse: 66  SpO2: 99%   General: Well Developed, well nourished, and in no acute distress.   MSK: Left ankle mild swelling anterior lateral ankle.  Otherwise normal. Range of motion lacks full plantarflexion full dorsiflexion by few degrees. Intact strength. Stable ligamentous exam. Mildly tender palpation ATFL region.  Lab and Radiology Results  EXAM: LEFT ANKLE COMPLETE - 3+ VIEW  COMPARISON:  None.  FINDINGS: There is a mildly displaced fracture through the distal fibula with overlying soft tissue swelling.  IMPRESSION: Mildly displaced fracture through the distal fibula with significant overlying soft tissue swelling.   Electronically Signed   By: Gerome Sam III M.D   On: 03/25/2020 16:29 I, Clementeen Graham, personally (independently) visualized and performed the interpretation of the images attached in this note.  New x-ray left ankle ordered today will be done in the near  future.  Diagnostic Limited MSK Ultrasound of: Left ankle Fracture visible at left ankle lateral malleolus appears healed but still somewhat displaced. Mild osteophyte anterior talus potentially causing impingement with dorsiflexion. Mild ankle effusion anterior lateral ankle joint. Impression: Mild degenerative changes posttraumatic ankle.    Impression and Recommendations:    Assessment and Plan: 55 y.o. female with left ankle stiffness swelling and pain following ankle fracture September 2021.  Patient had good conservative management last year but is not thriving.  Plan to obtain x-ray in the near future when she certifies that it will be covered with her insurance.  Additionally will proceed with conservative management with physical therapy, compression of ankle sleeve, Voltaren gel, and recheck in 6 weeks.  If not better consider MRI or injection.Marland Kitchen  PDMP not reviewed this encounter. Orders Placed This Encounter  Procedures  . [place] LIMITED JOINT SPACE STRUCTURES LOW LEFT(NO LINKED CHARGES)    Standing Status:   Future    Number of Occurrences:   1    Standing Expiration Date:   12/01/2021    Order Specific Question:   Reason for Exam (SYMPTOM  OR DIAGNOSIS REQUIRED)    Answer:   Left ankle pain    Order Specific Question:   Preferred imaging location?    Answer:   [facility]  . DG Ankle Complete Left    Standing Status:   Future    Standing Expiration Date:   12/01/2021    Order Specific Question:   Reason for Exam (SYMPTOM  OR DIAGNOSIS REQUIRED)  Answer:   Left ankle pain    Order Specific Question:   Is patient pregnant?    Answer:   No    Order Specific Question:   Preferred imaging location?    Answer:   [facility]  . Ambulatory referral to Physical Therapy    Referral Priority:   Routine    Referral Type:   Physical Medicine    Referral Reason:   Specialty Services Required    Requested Specialty:   Physical Therapy   No orders of the defined types were  placed in this encounter.   Discussed warning signs or symptoms. Please see discharge instructions. Patient expresses understanding.   The above documentation has been reviewed and is accurate and complete Clementeen Graham, M.D.

## 2020-12-01 ENCOUNTER — Encounter: Payer: Self-pay | Admitting: Family Medicine

## 2020-12-01 ENCOUNTER — Ambulatory Visit (INDEPENDENT_AMBULATORY_CARE_PROVIDER_SITE_OTHER): Payer: Commercial Managed Care - PPO | Admitting: Family Medicine

## 2020-12-01 ENCOUNTER — Other Ambulatory Visit: Payer: Self-pay

## 2020-12-01 ENCOUNTER — Ambulatory Visit: Payer: Self-pay

## 2020-12-01 ENCOUNTER — Telehealth: Payer: Self-pay | Admitting: Family Medicine

## 2020-12-01 VITALS — BP 100/60 | HR 66 | Ht 66.0 in | Wt 136.0 lb

## 2020-12-01 DIAGNOSIS — M25572 Pain in left ankle and joints of left foot: Secondary | ICD-10-CM

## 2020-12-01 DIAGNOSIS — Z780 Asymptomatic menopausal state: Secondary | ICD-10-CM

## 2020-12-01 NOTE — Patient Instructions (Signed)
Thank you for coming in today.  I've referred you to Physical Therapy.  Let us know if you don't hear from them in one week.  Please get an Xray today or in the near future.   Please use Voltaren gel (Generic Diclofenac Gel) up to 4x daily for pain as needed.  This is available over-the-counter as both the name brand Voltaren gel and the generic diclofenac gel.  I recommend you obtained a compression sleeve to help with your joint problems. There are many options on the market however I recommend obtaining a full ankle Body Helix compression sleeve.  You can find information (including how to appropriate measure yourself for sizing) can be found at www.[employer].ch.  Many of these products are health savings account (HSA) eligible.   You can use the compression sleeve at any time throughout the day but is most important to use while being active as well as for 2 hours post-activity.   It is appropriate to ice following activity with the compression sleeve in place.  Recheck in 6 weeks.

## 2020-12-01 NOTE — Telephone Encounter (Signed)
Patient said that Dr Denyse Amass mentioned ordering a Bone Density for the patient. She would like to have this done at [facility] on [address]. I have her scheduled for Monday June 6th.  Can this order be put in for her please?  * Patient also states that she contacted her insurance and she is able to get the xray. She plans to have this done while she is at [facility] for the bone density.

## 2020-12-01 NOTE — Telephone Encounter (Signed)
Bone density test and ankle x-ray ordered for the [place] office.

## 2020-12-07 ENCOUNTER — Ambulatory Visit: Payer: Commercial Managed Care - PPO | Admitting: Physical Therapy

## 2020-12-07 ENCOUNTER — Other Ambulatory Visit: Payer: Self-pay

## 2020-12-07 DIAGNOSIS — M25572 Pain in left ankle and joints of left foot: Secondary | ICD-10-CM

## 2020-12-11 ENCOUNTER — Other Ambulatory Visit: Payer: Self-pay

## 2020-12-11 ENCOUNTER — Ambulatory Visit (INDEPENDENT_AMBULATORY_CARE_PROVIDER_SITE_OTHER)
Admission: RE | Admit: 2020-12-11 | Discharge: 2020-12-11 | Disposition: A | Discharge status: Home / Self Care | Payer: Commercial Managed Care - PPO | Source: Ambulatory Visit | Attending: Family Medicine | Admitting: Family Medicine

## 2020-12-11 DIAGNOSIS — M25572 Pain in left ankle and joints of left foot: Secondary | ICD-10-CM | POA: Diagnosis not present

## 2020-12-11 DIAGNOSIS — Z780 Asymptomatic menopausal state: Secondary | ICD-10-CM | POA: Diagnosis not present

## 2020-12-12 NOTE — Progress Notes (Signed)
Bone mineral density is normal

## 2020-12-12 NOTE — Progress Notes (Signed)
Left ankle x-ray shows evidence of an old healed fracture.  Otherwise the ankle looks normal to radiology

## 2020-12-14 ENCOUNTER — Encounter: Payer: Self-pay | Admitting: Physical Therapy

## 2020-12-14 NOTE — Therapy (Addendum)
[facility] [address] [place], [place], [address] Phone: [phone]   Fax:  [phone]  Physical Therapy Evaluation  Patient Details  Name: Caitlin Chandler MRN: [id number] Date of Birth: 1965-07-22 Referring Provider (PT): Lynne Leader   Encounter Date: 12/07/2020   PT End of Session - 12/14/20 1143     Visit Number 1    Number of Visits 12    Date for PT Re-Evaluation 01/18/21    Authorization Type UHC    PT Start Time 1602    PT Stop Time 1644    PT Time Calculation (min) 42 min    Activity Tolerance Patient tolerated treatment well    Behavior During Therapy [facility] for tasks assessed/performed             History reviewed. No pertinent past medical history.  Past Surgical History:  Procedure Laterality Date   CHOLECYSTECTOMY     SHOULDER SURGERY Left     There were no vitals filed for this visit.   Subjective Assessment - 12/14/20 1127     Subjective Pt states ongoing pain in L ankle. She had distal fib fracture in Sept 2021, did have some rehab, but did not get to finish due to insurance change. recent x-ray shows fx healed but has some displacement. Pt feels she is still having pain and difficulty with activity. She has not been able to return to level of activity that she was doing, or exercise, due to pain and swelling. Likes yoga, weights, hiking,    Pertinent History Non operative distal fibula fracture Sept 2021.    Limitations Standing;Walking;House hold activities    Patient Stated Goals decreased pain, increased level of activity and exercise wthout pain    Currently in Pain? Yes    Pain Score 4     Pain Location Ankle    Pain Orientation Left    Pain Descriptors / Indicators Aching;Sore    Pain Type Chronic pain    Pain Onset More than a month ago    Pain Frequency Intermittent    Aggravating Factors  standing, walking, exercise, bending ankle    Pain Relieving Factors rest                 [facility] PT Assessment - 12/14/20 0001       Assessment   Medical Diagnosis L ankle pain    Referring Provider (PT) Lynne Leader    Onset Date/Surgical Date 03/29/20    Prior Therapy after initial Fx      Precautions   Precautions None      Balance Screen   Has the patient fallen in the past 6 months No      Prior Function   Level of Independence Independent      Cognition   Overall Cognitive Status Within Functional Limits for tasks assessed      ROM / Strength   AROM / PROM / Strength AROM;Strength      AROM   Overall AROM Comments DF: mod/significant limitation: 2 deg, Mild deficit for Ev.  mild deficit for R hip ER and flexion, vs L side.      Strength   Overall Strength Comments L ankle: 4-/5;      Palpation   Palpation comment Hypomobile L ankle for DF, Tenderness at lateral malleolus with palpation; Mild hypomobility of R hip vs L.      Special Tests   Other special tests Anterior ankle pain with increased DF.  Ambulation/Gait   Gait Comments Decreased terminal stance and push off,                           [facility] Adult PT Treatment/Exercise - 12/14/20 0001       Exercises   Exercises Ankle      Ankle Exercises: Stretches   Gastroc Stretch 3 reps;30 seconds    Other Stretch DF glides on step x 10, with BLTB x 20;      Ankle Exercises: Standing   Heel Raises 20 reps                      PT Short Term Goals - 12/14/20 1146       PT SHORT TERM GOAL #1   Title Pt to be independent with initial HEP    Time 2    Period Weeks    Status New    Target Date 12/21/20               PT Long Term Goals - 12/14/20 1146       PT LONG TERM GOAL #1   Title Pt to be independent with final HEP    Time 6    Period Weeks    Status New    Target Date 01/18/21      PT LONG TERM GOAL #2   Title Pt to report decreased pain in L ankle, to 0-2/10 with activity.    Time 6    Period Weeks    Status New    Target Date 01/18/21       PT LONG TERM GOAL #3   Title Pt to report ability for exercise of walking, hiking, and yoga for at least 30 min without pain or deficit, to improve ability for return to exercise activities.    Time 6    Period Weeks    Status New    Target Date 01/18/21      PT LONG TERM GOAL #4   Title Pt to demo strenth of L ankle to be 5/5, and NMC to be WNL for Single leg and unstable surfaces, to improve stability and safety with functional activity.    Time 6    Period Weeks    Status New    Target Date 01/18/21      PT LONG TERM GOAL #5   Title Pt to demo improved DF by at least 3 deg to improve gait mechanics and pain.    Time 6    Period Weeks    Status New    Target Date 01/18/21                   Plan - 12/14/20 1156     Clinical Impression Statement Pt presents with primary complaint of increased pain in L ankle, stemming from fracture in september. She has remaining deficits in ankle that are causing increased pain with progression back to activity. She has stiffness and lack of full ROM for DF, with increased pain anteriorly. Stiffness is limiting gait mechanics and stair ability. Pt with weakness and instability in ankle as well, with decreased ability for single leg and unstable surfaces. Pt to benefit from skilled PT to improve deficits, pain and return to higher level of function and exercise.    Personal Factors and Comorbidities Behavior Pattern;Fitness;Past/Current Experience;Social Background    Examination-Activity Limitations Bed Mobility;Locomotion Level;Stairs;Squat;Stand    Examination-Participation Restrictions Yard Work;Cleaning;Community  Activity;Shop    Stability/Clinical Decision Making Stable/Uncomplicated    Rehab Potential Good    PT Frequency 2x / week    PT Duration 6 weeks    PT Treatment/Interventions Electrical Stimulation;Cryotherapy;Moist Heat;Balance training;Neuromuscular re-education;Patient/family education;Manual techniques;Orthotic  Fit/Training;Dry needling;Taping;Passive range of motion;Joint Manipulations;Therapeutic activities;Therapeutic exercise;Stair training    PT Next Visit Plan manual; balance; AROM    PT Home Exercise Plan LLE towel scrunches; LLE towel inversion/eversion; sidelying L hip abduction; prone L hip extension    Consulted and Agree with Plan of Care Patient             Patient will benefit from skilled therapeutic intervention in order to improve the following deficits and impairments:  Abnormal gait, Decreased balance, Decreased endurance, Decreased mobility, Decreased strength, Decreased activity tolerance, Decreased range of motion, Pain, Postural dysfunction, Improper body mechanics, Difficulty walking, Hypomobility, Decreased coordination  Visit Diagnosis: Pain in left ankle and joints of left foot     Problem List Patient Active Problem List   Diagnosis Date Noted   Celiac disease 07/24/2010    Lyndee Hensen, PT, DPT 12:07 PM  12/14/20    [facility] [place], [place], [address] Phone: [phone]   Fax:  [phone]  Name: Caitlin Chandler MRN: [id number] Date of Birth: 06/16/1966  PHYSICAL THERAPY DISCHARGE SUMMARY  Visits from Start of Care: 1 Plan: Patient agrees to discharge.  Patient goals were not met. Patient is being discharged due to not returning since last visit.     Lyndee Hensen, PT, DPT 2:36 PM  07/05/21

## 2020-12-18 ENCOUNTER — Ambulatory Visit (HOSPITAL_BASED_OUTPATIENT_CLINIC_OR_DEPARTMENT_OTHER): Payer: Commercial Managed Care - PPO | Admitting: Family Medicine

## 2021-01-12 ENCOUNTER — Other Ambulatory Visit: Payer: Self-pay | Admitting: Obstetrics and Gynecology

## 2021-01-12 DIAGNOSIS — Z1231 Encounter for screening mammogram for malignant neoplasm of breast: Secondary | ICD-10-CM

## 2021-01-12 NOTE — Progress Notes (Deleted)
   I, Christoper Fabian, LAT, ATC, am serving as scribe for Dr. Clementeen Graham.  Caitlin Chandler is a 55 y.o. female who presents to [facility] at [facility] today for f/u of L ankle pain that began after suffering a L distal fibular fx on 03/25/20.  She was last seen by Dr. Denyse Amass on 12/01/20 and was referred to PT of which she completed one session.  She was also advised to use Voltaren gel and an ankle compression sleeve.  Since her last visit, pt reports   Diagnostic testing: L ankle XR- 12/11/20, 03/25/20  Pertinent review of systems: ***  Relevant historical information: ***   Exam:  There were no vitals taken for this visit. General: Well Developed, well nourished, and in no acute distress.   MSK: ***    Lab and Radiology Results No results found for this or any previous visit (from the past 72 hour(s)). No results found.     Assessment and Plan: 55 y.o. female with ***   PDMP not reviewed this encounter. No orders of the defined types were placed in this encounter.  No orders of the defined types were placed in this encounter.    Discussed warning signs or symptoms. Please see discharge instructions. Patient expresses understanding.   ***

## 2021-01-15 ENCOUNTER — Ambulatory Visit: Payer: Commercial Managed Care - PPO | Admitting: Family Medicine

## 2021-03-14 ENCOUNTER — Ambulatory Visit: Payer: Self-pay

## 2021-03-14 ENCOUNTER — Ambulatory Visit (INDEPENDENT_AMBULATORY_CARE_PROVIDER_SITE_OTHER): Payer: Commercial Managed Care - PPO

## 2021-03-14 ENCOUNTER — Other Ambulatory Visit: Payer: Self-pay

## 2021-03-14 ENCOUNTER — Ambulatory Visit (INDEPENDENT_AMBULATORY_CARE_PROVIDER_SITE_OTHER): Payer: Commercial Managed Care - PPO | Admitting: Family Medicine

## 2021-03-14 VITALS — BP 112/72 | HR 76 | Ht 66.0 in | Wt 131.4 lb

## 2021-03-14 DIAGNOSIS — M25551 Pain in right hip: Secondary | ICD-10-CM

## 2021-03-14 DIAGNOSIS — M25561 Pain in right knee: Secondary | ICD-10-CM

## 2021-03-14 NOTE — Patient Instructions (Signed)
Thank you for coming in today.   Please use Voltaren gel (Generic Diclofenac Gel) up to 4x daily for pain as needed.  This is available over-the-counter as both the name brand Voltaren gel and the generic diclofenac gel.   Avoid deep knee flexion.   I recommend you obtained a compression sleeve to help with your joint problems. There are many options on the market however I recommend obtaining a full knee Body Helix compression sleeve.  You can find information (including how to appropriate measure yourself for sizing) can be found at [email or web address].  Many of these products are health savings account (HSA) eligible.   You can use the compression sleeve at any time throughout the day but is most important to use while being active as well as for 2 hours post-activity.   It is appropriate to ice following activity with the compression sleeve in place.   Work on Systems developer.   If not improving or if worsening I can order an MRI to evaluate the knee further.   Meniscus Tear, Phase I Rehab Ask your health care provider which exercises are safe for you. Do exercises exactly as told by your health care provider and adjust them as directed. It is normal to feel mild stretching, pulling, tightness, or discomfort as you do these exercises. Stop right away if you feel sudden pain or your pain gets worse. Do not begin these exercises until told by your health care provider. Stretching and range-of-motion exercises These exercises warm up your muscles and joints and improve the movement and flexibility of your knee. These exercises also help to relieve pain and stiffness. Active knee flexion, supine  Lie on your back (supine position) with both knees straight. If this causes back discomfort, bend your uninjured knee so your foot is flat on the floor. Slowly slide your left / right heel toward your buttocks (active) until you feel a gentle stretch in the front of your knee or thigh (flexion). Stop if  you have pain. Hold this position for __________ seconds. Slowly slide your left / right heel back to the starting position. Repeat __________ times. Complete this exercise __________ times a day. Passive knee extension, sitting  Sit with your left / right heel propped up on a chair, a coffee table, or a footstool. Do not have anything under your knee to support it. Without using any effort (passive), allow your leg muscles to relax, letting gravity straighten out your knee (extension). Do not let your knee turn inward. You should feel a stretch behind your left / right knee. If told by your health care provider, deepen the stretch by placing a __________ lb / kg weight on your thigh, just above your kneecap. Hold this position for __________ seconds. Repeat __________ times. Complete this exercise __________ times a day. Strengthening exercises These exercises build strength and endurance in your knee. Endurance is the ability to use your muscles for a long time, even after they get tired. Quadriceps, isometric  Lie on your back with your left / right leg extended and your other knee bent. Put a rolled towel or a small pillow under your left / right knee if told by your health care provider. Without moving your knee joint (isometric), slowly tense the muscles in the front of your left / right thigh (quadriceps). You should see your kneecap slide up toward your hip or see increased dimpling just above the knee. This motion will push the back of your knee toward  the floor. For __________ seconds, hold the muscle as tight as you can without increasing your pain. Relax the muscles slowly and completely. Repeat __________ times. Complete this exercise __________ times a day. Straight leg raises, supine This exercise strengthens the muscles in the front of your thigh (quadriceps). Lie on your back (supine position) with your left / right leg extended and your other knee bent. Tense the muscles in  the front of your left / right thigh. You should see your kneecap slide up toward your hip or see increased dimpling just above the knee. Keep these muscles tight as you raise your leg 4-6 inches (10-15 cm) off the floor. Do not let your knee bend. Hold this position for __________ seconds. Keep these muscles tense as you lower your leg. Relax the muscles slowly and completely after each repetition. Repeat __________ times. Complete this exercise __________ times a day. Hamstring curls  On the floor or a bed, lie on your abdomen with your legs straight. Put a folded towel or a small pillow under your left / right thigh, just above your kneecap. Slowly bend your left / right knee as far as you can without pain. Keep your hips flat against the floor or bed. Hold this position for __________ seconds. Slowly lower your leg to the starting position. Repeat __________ times. Complete this exercise __________ times a day. This information is not intended to replace advice given to you by your health care provider. Make sure you discuss any questions you have with your health care provider. Document Revised: 10/25/2019 Document Reviewed: 10/25/2019 Elsevier Patient Education  2022 [employer].

## 2021-03-14 NOTE — Progress Notes (Signed)
I, Philbert Riser, LAT, ATC acting as a scribe for Clementeen Graham, MD.  Caitlin Chandler is a 55 y.o. female who presents to [facility] today for R knee and R hip pain. Pt previously was seen by Dr. Denyse Amass on 12/01/20 for L ankle pain. Today, pt reports R knee pain ongoing intermittently for a year, w/ worsening pain over the last week. 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 Pt locates pain to posterior aspect of the R knee and along the lateral and anterior aspects. Pt c/o a "tightness" when trying to full extend R knee  R knee swelling: sometimes Mechanical symptoms: yes Aggravates: exercise, walking, sitting for extended periods Treatments tried: ice, heat, tylenol  Pt also c/o R hip flexor tightness. Pt locates pain to anterior aspect of her R hip.  Radiates: hard to tell due to sciatica LE numbness/tingling: no LE weakness: yes- hip flexor Aggravates: squatting, stretching Treatments tried: massage gun   Pertinent review of systems: No fevers or chills  Relevant historical information: History of ankle fracture.  Hypermobility.   Exam:  BP 112/72   Pulse 76   Ht 5\' 6"  (1.676 m)   Wt 131 lb 6.4 oz (59.6 kg)   SpO2 99%   BMI 21.21 kg/m  General: Well Developed, well nourished, and in no acute distress.   MSK: Right hip normal.  Normal motion pain with flexion and internal rotation. Strength intact.  Right knee normal-appearing normal motion some pain with the palpable and audible clunk with full knee flexion. Tender palpation medial joint line. Stable ligamentous exam. Positive McMurray's test medially. Intact strength.    Lab and Radiology Results  Diagnostic Limited MSK Ultrasound of: Right knee Quad tendon intact and normal-appearing Patellar tendon normal-appearing Lateral meniscus normal. Medial meniscus slight degenerative appearing Posterior knee trace Baker's cyst. Impression: Probable medial  meniscus tear.   X-ray images right knee obtained today personally and independently interpreted No acute fractures.  No significant degenerative changes. Await formal radiology review   Assessment and Plan: 55 y.o. female with right knee pain with mechanical symptoms.  Patient describes a significant clunk with knee flexion and as well as some locking sensations.  This is concerning for medial meniscus tear or loose body.  She would like very much to avoid surgery and would like to try some conservative management strategies first.  I think this is okay.  Plan for home exercise program working on quad strength and some watchful waiting along with compression sleeve and Voltaren gel.  If not improving in the next 6 weeks or so I would recommend neck step to be MRI to further characterize cause of her symptoms.  Additionally discussed some activity restrictions.  Recommend avoiding deep full flexion loaded knee activity.  Addition she has some anterior hip pain that I am concerning is coming from some hip impingement.  She is doing a lot of spin activity which may be exacerbating this.  Discussed how to modify her seating position on the spin bike as well as some changing in some of the spin activities.  Certainly can proceed with further work-up in the future if needed.   PDMP not reviewed this encounter. Orders Placed This Encounter  Procedures   58 LIMITED JOINT SPACE STRUCTURES LOW RIGHT(NO LINKED CHARGES)    Standing Status:   Future    Number of Occurrences:   1    Standing Expiration Date:   09/11/2021  Order Specific Question:   Reason for Exam (SYMPTOM  OR DIAGNOSIS REQUIRED)    Answer:   right knee pain    Order Specific Question:   Preferred imaging location?    Answer:   [facility] Knee AP/LAT W/[place] Right    Standing Status:   Future    Number of Occurrences:   1    Standing Expiration Date:   03/14/2022    Order Specific Question:   Reason for  Exam (SYMPTOM  OR DIAGNOSIS REQUIRED)    Answer:   right knee pain    Order Specific Question:   Preferred imaging location?    Answer:   [facility]    Order Specific Question:   Is patient pregnant?    Answer:   No   No orders of the defined types were placed in this encounter.    Discussed warning signs or symptoms. Please see discharge instructions. Patient expresses understanding.   The above documentation has been reviewed and is accurate and complete Clementeen Graham, M.D.

## 2021-03-19 NOTE — Progress Notes (Signed)
Right knee x-ray shows a little bit of fluid in the knee joint.

## 2021-11-28 IMAGING — DX DG ANKLE COMPLETE 3+V*L*
3 series · 3 of 3 positions shown · non-contrast
Comparison: 03/25/2020

CLINICAL DATA: Follow-up left ankle fracture.

EXAM:
LEFT ANKLE COMPLETE - 3+ VIEW

[ankle ap]
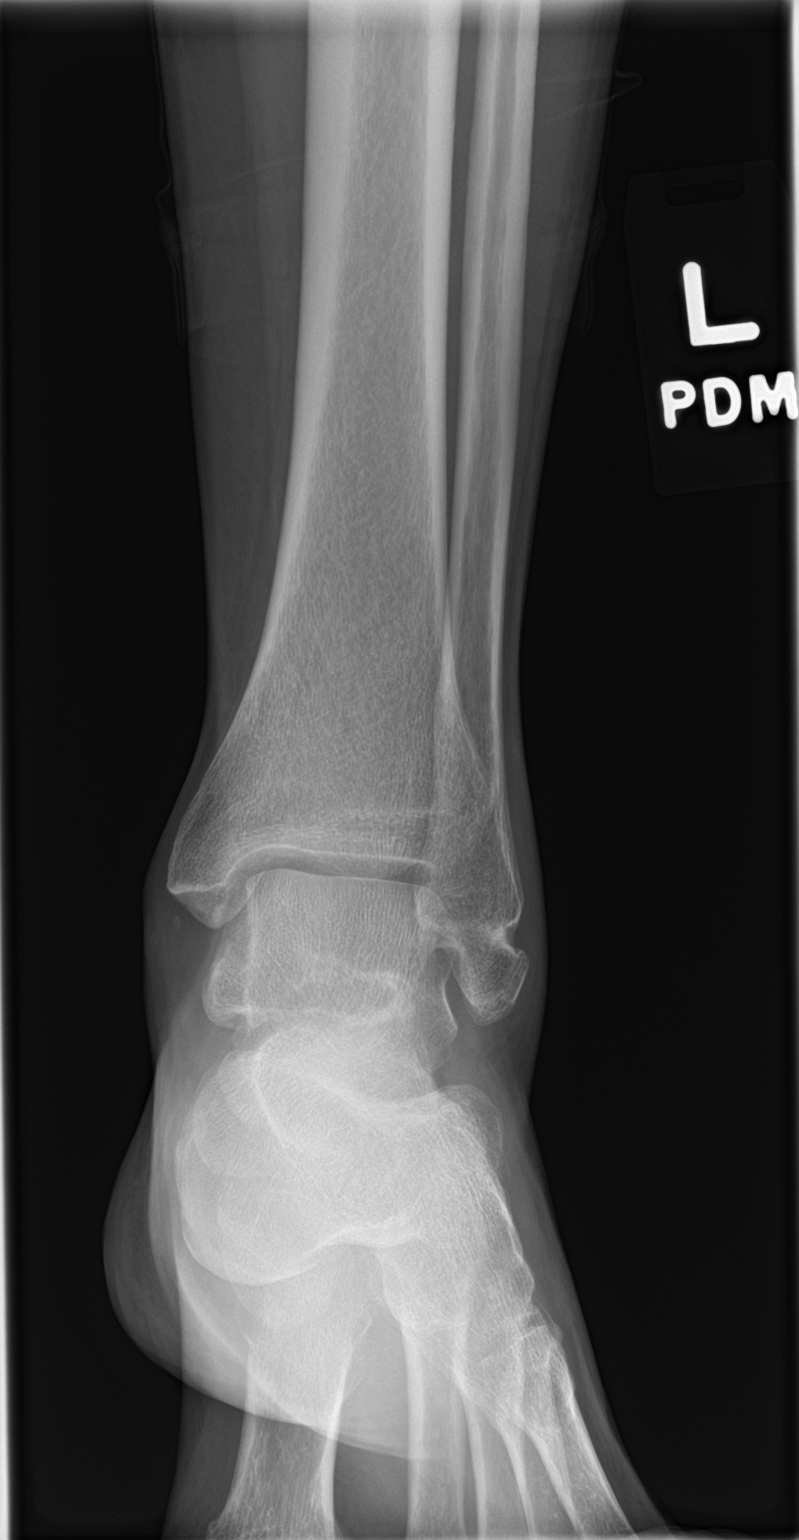

[ankle obl]
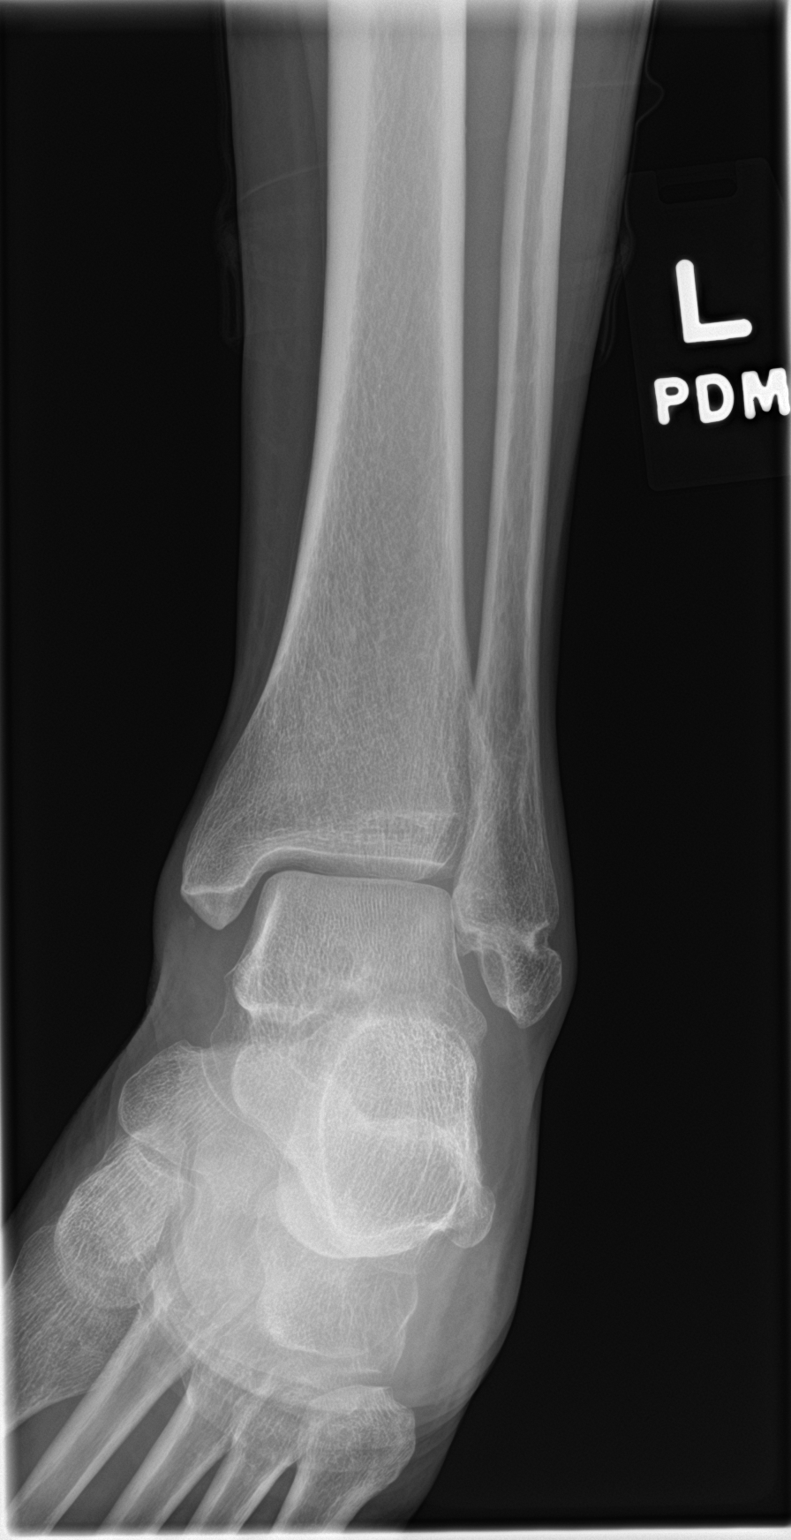

[ankle lat]
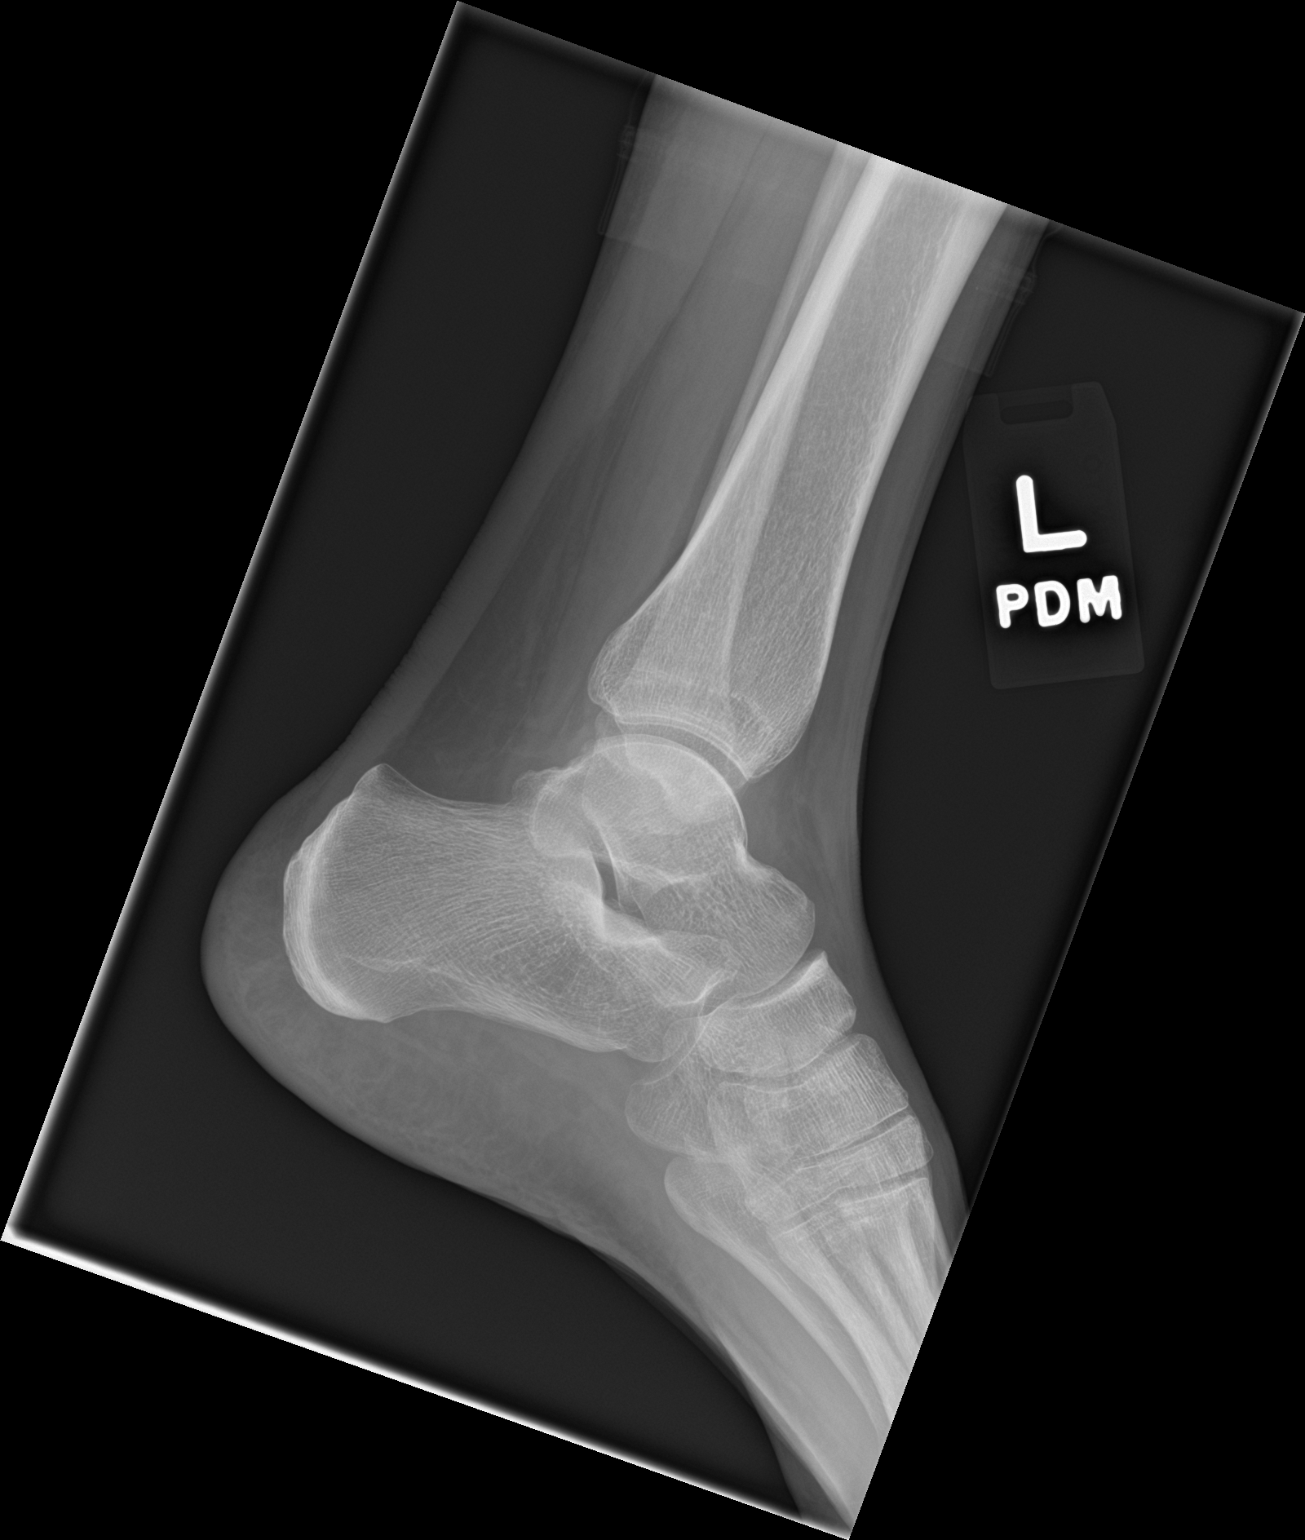

[3 of 3 positions shown; findings below may reference images not displayed]

FINDINGS: Bone remodeling over the lateral malleolus compatible patient's old
distal fibular fracture. No acute fracture or dislocation. Ankle
mortise is normal. Remainder of the exam is unchanged.
IMPRESSION: No acute findings.  Old distal fibular fracture as described.

## 2022-03-14 ENCOUNTER — Other Ambulatory Visit: Payer: Self-pay | Admitting: Nurse Practitioner

## 2022-03-14 DIAGNOSIS — Z803 Family history of malignant neoplasm of breast: Secondary | ICD-10-CM

## 2022-03-15 ENCOUNTER — Other Ambulatory Visit: Payer: Self-pay | Admitting: Nurse Practitioner

## 2022-03-15 DIAGNOSIS — Z803 Family history of malignant neoplasm of breast: Secondary | ICD-10-CM

## 2022-06-20 DIAGNOSIS — F4320 Adjustment disorder, unspecified: Secondary | ICD-10-CM | POA: Diagnosis not present

## 2022-07-11 DIAGNOSIS — F4320 Adjustment disorder, unspecified: Secondary | ICD-10-CM | POA: Diagnosis not present

## 2022-07-18 DIAGNOSIS — F4320 Adjustment disorder, unspecified: Secondary | ICD-10-CM | POA: Diagnosis not present

## 2022-07-31 DIAGNOSIS — F4320 Adjustment disorder, unspecified: Secondary | ICD-10-CM | POA: Diagnosis not present

## 2022-07-31 DIAGNOSIS — R6882 Decreased libido: Secondary | ICD-10-CM | POA: Diagnosis not present

## 2022-08-08 ENCOUNTER — Telehealth: Payer: Self-pay | Admitting: Genetic Counselor

## 2022-08-08 NOTE — Telephone Encounter (Signed)
Scheduled appt per 1/31 referral. Pt is aware of appt date and time. Pt is aware to arrive 15 mins prior to appt time and to bring and updated insurance card. Pt is aware of appt location.

## 2022-08-13 DIAGNOSIS — R6882 Decreased libido: Secondary | ICD-10-CM | POA: Diagnosis not present

## 2022-08-13 DIAGNOSIS — N912 Amenorrhea, unspecified: Secondary | ICD-10-CM | POA: Diagnosis not present

## 2022-08-13 DIAGNOSIS — N951 Menopausal and female climacteric states: Secondary | ICD-10-CM | POA: Diagnosis not present

## 2022-08-13 DIAGNOSIS — Z79890 Hormone replacement therapy (postmenopausal): Secondary | ICD-10-CM | POA: Diagnosis not present

## 2022-08-14 DIAGNOSIS — F4320 Adjustment disorder, unspecified: Secondary | ICD-10-CM | POA: Diagnosis not present

## 2022-09-12 DIAGNOSIS — F4320 Adjustment disorder, unspecified: Secondary | ICD-10-CM | POA: Diagnosis not present

## 2022-09-18 DIAGNOSIS — F4320 Adjustment disorder, unspecified: Secondary | ICD-10-CM | POA: Diagnosis not present

## 2022-10-02 ENCOUNTER — Inpatient Hospital Stay: Payer: BC Managed Care – PPO

## 2022-10-02 ENCOUNTER — Inpatient Hospital Stay: Payer: BC Managed Care – PPO | Attending: Genetic Counselor | Admitting: Genetic Counselor

## 2022-10-02 ENCOUNTER — Other Ambulatory Visit: Payer: Self-pay

## 2022-10-02 DIAGNOSIS — Z803 Family history of malignant neoplasm of breast: Secondary | ICD-10-CM

## 2022-10-02 DIAGNOSIS — Z800 Family history of malignant neoplasm of digestive organs: Secondary | ICD-10-CM | POA: Diagnosis not present

## 2022-10-02 DIAGNOSIS — Z8042 Family history of malignant neoplasm of prostate: Secondary | ICD-10-CM | POA: Diagnosis not present

## 2022-10-03 ENCOUNTER — Encounter: Payer: Self-pay | Admitting: Genetic Counselor

## 2022-10-03 DIAGNOSIS — Z803 Family history of malignant neoplasm of breast: Secondary | ICD-10-CM | POA: Insufficient documentation

## 2022-10-03 DIAGNOSIS — Z8042 Family history of malignant neoplasm of prostate: Secondary | ICD-10-CM | POA: Insufficient documentation

## 2022-10-03 DIAGNOSIS — Z800 Family history of malignant neoplasm of digestive organs: Secondary | ICD-10-CM | POA: Insufficient documentation

## 2022-10-03 NOTE — Progress Notes (Signed)
REFERRING PROVIDER: Caren Macadam, MD [place],  [place] [address]  PRIMARY PROVIDER:  Patient, No Pcp Per  PRIMARY REASON FOR VISIT:  1. Family history of breast cancer   2. Family history of pancreatic cancer   3. Family history of prostate cancer      HISTORY OF PRESENT ILLNESS:   Caitlin Chandler, a 57 y.o. female, was seen for a [place] cancer genetics consultation at the request of Dr. Mannie Stabile due to a family history of cancer.  Caitlin Chandler presents to clinic today to discuss the possibility of a hereditary predisposition to cancer, genetic testing, and to further clarify her future cancer risks, as well as potential cancer risks for family members.   Caitlin Chandler is a 57 y.o. female with no personal history of cancer.  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  She states she was tested at that time for Lynch syndrome and BRCA1 and BRCA2 genes.  CANCER HISTORY:  Oncology History   No history exists.     RISK FACTORS:  Menarche was at age 29.  First live birth at age 60.  OCP use for approximately 10 years.  Ovaries intact: yes.  Hysterectomy: no.  Menopausal status: postmenopausal.  HRT use: 2 months. Colonoscopy: yes; normal. Mammogram within the last year: yes. Number of breast biopsies: 2. Up to date with pelvic exams: yes. Any excessive radiation exposure in the past: no  Past Medical History:  Diagnosis Date   Family history of breast cancer    Family history of pancreatic cancer    Family history of prostate cancer     Past Surgical History:  Procedure Laterality Date   CHOLECYSTECTOMY     SHOULDER SURGERY Left     Social History   Socioeconomic History   Marital status: Divorced    Spouse name: Not on file   Number of children: Not on file   Years of education: Not on file   Highest education level: Not on file  Occupational History   Not on file   Tobacco Use   Smoking status: Never   Smokeless tobacco: Never  Vaping Use   Vaping Use: Never used  Substance and Sexual Activity   Alcohol use: Never   Drug use: Never   Sexual activity: Not on file  Other Topics Concern   Not on file  Social History Narrative   Not on file   Social Determinants of Health   Financial Resource Strain: Not on file  Food Insecurity: Not on file  Transportation Needs: Not on file  Physical Activity: Not on file  Stress: Not on file  Social Connections: Not on file     FAMILY HISTORY:  We obtained a detailed, 4-generation family history.  Significant diagnoses are listed below: Family History  Problem Relation Age of Onset   Breast cancer Mother 61   Leukemia Mother 56       AML   Uterine cancer Mother        dx 98s-30s   Ovarian cancer Mother        dx 55s-30s   Lung cancer Father    Breast cancer Sister 55   Melanoma Maternal Aunt    Pancreatic cancer Maternal Uncle 13   Prostate cancer Maternal Uncle 63   Prostate cancer Maternal Uncle    Colon polyps Maternal Grandmother        d. 100  Prostate cancer Maternal Grandfather    Breast cancer Cousin 18     The patient has a son and daughter who are cancer free. She has a sister who was diagnosed with breast cancer in her 58's, and three maternal half brothers and a half sister who are cancer free.  Both parents are deceased.  The patient's father died at 67 from lung cancer.  The patient was estranged from her father and there is no further information on his family.  The patients mother was diagnosed with ovarian and uterine cancer in her 61's.  She was diagnosed with breast cancer and AML at 12 and died at 62.  She had three brothers and two sisters.  One brother had pancreatic and prostate cancer at age 93, another brother had liver cancer possibly from medications from his treatment of schizophrenia and a third brother had prostate cancer.  One sister had a daughter who was  diagnosed with breast cancer at 12.  The maternal grandparents are deceased.  The grandfather had prostate cancer and the grandmother had colon polyps and died at 38.  Caitlin Chandler is unaware of previous family history of genetic testing for hereditary cancer risks. Patient's maternal ancestors are of [place] descent, and paternal ancestors are of [place] descent. There is no reported Ashkenazi Jewish ancestry. There is no known consanguinity.  GENETIC COUNSELING ASSESSMENT: Caitlin Chandler is a 57 y.o. female with a family history of cancer which is somewhat suggestive of a hereditary cancer syndrome and predisposition to cancer given the combination of cancer and young ages of onset. We, therefore, discussed and recommended the following at today's visit.   DISCUSSION: We discussed that, in general, most cancer is not inherited in families, but instead is sporadic or familial. Sporadic cancers occur by chance and typically happen at older ages (>50 years) as this type of cancer is caused by genetic changes acquired during an individual's lifetime. Some families have more cancers than would be expected by chance; however, the ages or types of cancer are not consistent with a known genetic mutation or known genetic mutations have been ruled out. This type of familial cancer is thought to be due to a combination of multiple genetic, environmental, hormonal, and lifestyle factors. While this combination of factors likely increases the risk of cancer, the exact source of this risk is not currently identifiable or testable.  We discussed that 5 - 10% of breast cancer and up to 20% of ovarian cancer is hereditary, with most cases associated with BRCA mutations.  There are other genes that can be associated with hereditary breast cancer syndromes.  These include ATM, CHEK2 and PALB2, and other genes associated with ovarian cancer include RAD51C, RAD51D, BRIP1 and the Lynch syndrome genes.  We discussed that  testing is beneficial for several reasons including knowing how to follow individuals after completing their treatment, identifying whether potential treatment options such as PARP inhibitors would be beneficial, and understand if other family members could be at risk for cancer and allow them to undergo genetic testing.   We reviewed the characteristics, features and inheritance patterns of hereditary cancer syndromes. We also discussed genetic testing, including the appropriate family members to test, the process of testing, insurance coverage and turn-around-time for results. We discussed the implications of a negative, positive, carrier and/or variant of uncertain significant result. Caitlin Chandler  was offered a common hereditary cancer panel (47 genes) and an expanded pan-cancer panel (70 genes or larger). Caitlin Chandler was informed of  the benefits and limitations of each panel, including that expanded pan-cancer panels contain genes that do not have clear management guidelines at this point in time.  We also discussed that as the number of genes included on a panel increases, the chances of variants of uncertain significance increases. Caitlin Chandler decided to pursue genetic testing for the Custom-Cancer gene panel+RNA with Leukemia genes.  Based on Caitlin Chandler's family history of cancer, she meets medical criteria for genetic testing. Despite that she meets criteria, she may still have an out of pocket cost. We discussed that if her out of pocket cost for testing is over $100, the laboratory will call and confirm whether she wants to proceed with testing.  If the out of pocket cost of testing is less than $100 she will be billed by the genetic testing laboratory.   We discussed that some people do not want to undergo genetic testing due to fear of genetic discrimination.  The Genetic Information Nondiscrimination Act (GINA) was signed into federal law in 2008. GINA prohibits health insurers and  most employers from discriminating against individuals based on genetic information (including the results of genetic tests and family history information). According to GINA, health insurance companies cannot consider genetic information to be a preexisting condition, nor can they use it to make decisions regarding coverage or rates. GINA also makes it illegal for most employers to use genetic information in making decisions about hiring, firing, promotion, or terms of employment. It is important to note that GINA does not offer protections for life insurance, disability insurance, or long-term care insurance. GINA does not apply to those in the [employer], those who work for companies with less than 15 employees, and new life insurance or long-term disability insurance policies.  Health status due to a cancer diagnosis is not protected under GINA. More information about GINA can be found by visiting NightAgenda.se.   Based on the patient's family history, a statistical model (Tyrer Cusik) was used to estimate her risk of developing breast cancer. This estimates her lifetime risk of developing breast cancer to be approximately 26%. This estimation does not consider any genetic testing results.  The patient's lifetime breast cancer risk is a preliminary estimate based on available information using one of several models endorsed by the [place] (ACS). The ACS recommends consideration of breast MRI screening as an adjunct to mammography for patients at high risk (defined as 20% or greater lifetime risk). Please note that a woman's breast cancer risk changes over time. It may increase or decrease based on age and any changes to the personal and/or family medical history. The risks and recommendations listed above apply to this patient at this point in time. In the future, she may or may not be eligible for the same medical management strategies and, in some cases, other medical management strategies  may become available to her. If she is interested in an updated breast cancer risk assessment at a later date, she can contact us.  Caitlin Chandler has been determined to be at high risk for breast cancer.  Therefore, we recommend that annual screening with mammography and breast MRI be performed.  We discussed that Caitlin Chandler should discuss her individual situation with her referring physician and determine a breast cancer screening plan with which they are both comfortable.      PLAN: Despite our recommendation, Caitlin Chandler did not wish to pursue genetic testing at today's visit. We understand this decision and remain available to coordinate  genetic testing at any time in the future. We, therefore, recommend Caitlin Chandler continue to follow the cancer screening guidelines given by her primary healthcare provider.  Based on Caitlin Chandler's family history, we recommended her sister, who was diagnosed with breast cancer in her early 55's, have genetic counseling and testing. Caitlin Chandler will let us know if we can be of any assistance in coordinating genetic counseling and/or testing for this family member.   Lastly, we encouraged Caitlin Chandler to remain in contact with cancer genetics annually so that we can continuously update the family history and inform her of any changes in cancer genetics and testing that may be of benefit for this family.   Caitlin Chandler questions were answered to her satisfaction today. Our contact information was provided should additional questions or concerns arise. Thank you for the referral and allowing [place] to share in the care of your patient.   Alp Goldwater P. Florene Glen, [place], [facility] Licensed, Insurance risk surveyor [email or web address] phone: [phone]  The patient was seen for a total of 70 minutes in face-to-face genetic counseling.  The patient was seen alone.  Drs. Michell Heinrich, and/or [place] were available for questions, if needed..     _______________________________________________________________________ For Office Staff:  Number of people involved in session: 1 Was an Intern/ student involved with case: no

## 2022-10-10 DIAGNOSIS — F4320 Adjustment disorder, unspecified: Secondary | ICD-10-CM | POA: Diagnosis not present

## 2022-11-22 DIAGNOSIS — N3281 Overactive bladder: Secondary | ICD-10-CM | POA: Diagnosis not present

## 2022-11-22 DIAGNOSIS — N3941 Urge incontinence: Secondary | ICD-10-CM | POA: Diagnosis not present

## 2022-11-27 DIAGNOSIS — M7711 Lateral epicondylitis, right elbow: Secondary | ICD-10-CM | POA: Diagnosis not present

## 2022-12-20 DIAGNOSIS — N3946 Mixed incontinence: Secondary | ICD-10-CM | POA: Diagnosis not present

## 2023-01-03 DIAGNOSIS — Z79890 Hormone replacement therapy (postmenopausal): Secondary | ICD-10-CM | POA: Diagnosis not present

## 2023-01-03 DIAGNOSIS — N951 Menopausal and female climacteric states: Secondary | ICD-10-CM | POA: Diagnosis not present

## 2023-01-03 DIAGNOSIS — R6882 Decreased libido: Secondary | ICD-10-CM | POA: Diagnosis not present

## 2023-01-29 ENCOUNTER — Other Ambulatory Visit: Payer: Self-pay | Admitting: Nurse Practitioner

## 2023-01-29 DIAGNOSIS — Z803 Family history of malignant neoplasm of breast: Secondary | ICD-10-CM

## 2023-02-10 DIAGNOSIS — F4320 Adjustment disorder, unspecified: Secondary | ICD-10-CM | POA: Diagnosis not present

## 2023-02-27 DIAGNOSIS — F4320 Adjustment disorder, unspecified: Secondary | ICD-10-CM | POA: Diagnosis not present

## 2023-03-02 ENCOUNTER — Other Ambulatory Visit: Payer: No Typology Code available for payment source

## 2023-03-07 DIAGNOSIS — F4320 Adjustment disorder, unspecified: Secondary | ICD-10-CM | POA: Diagnosis not present

## 2023-03-13 DIAGNOSIS — F4320 Adjustment disorder, unspecified: Secondary | ICD-10-CM | POA: Diagnosis not present

## 2023-03-17 DIAGNOSIS — F4320 Adjustment disorder, unspecified: Secondary | ICD-10-CM | POA: Diagnosis not present

## 2023-03-26 DIAGNOSIS — F4320 Adjustment disorder, unspecified: Secondary | ICD-10-CM | POA: Diagnosis not present

## 2023-03-31 DIAGNOSIS — R399 Unspecified symptoms and signs involving the genitourinary system: Secondary | ICD-10-CM | POA: Diagnosis not present

## 2023-03-31 DIAGNOSIS — N941 Unspecified dyspareunia: Secondary | ICD-10-CM | POA: Diagnosis not present

## 2023-03-31 DIAGNOSIS — N930 Postcoital and contact bleeding: Secondary | ICD-10-CM | POA: Diagnosis not present

## 2023-04-17 DIAGNOSIS — F4320 Adjustment disorder, unspecified: Secondary | ICD-10-CM | POA: Diagnosis not present

## 2023-04-21 DIAGNOSIS — Z01419 Encounter for gynecological examination (general) (routine) without abnormal findings: Secondary | ICD-10-CM | POA: Diagnosis not present

## 2023-04-21 DIAGNOSIS — N898 Other specified noninflammatory disorders of vagina: Secondary | ICD-10-CM | POA: Diagnosis not present

## 2023-04-21 DIAGNOSIS — Z8041 Family history of malignant neoplasm of ovary: Secondary | ICD-10-CM | POA: Diagnosis not present

## 2023-04-21 DIAGNOSIS — Z124 Encounter for screening for malignant neoplasm of cervix: Secondary | ICD-10-CM | POA: Diagnosis not present

## 2023-04-21 DIAGNOSIS — Z1151 Encounter for screening for human papillomavirus (HPV): Secondary | ICD-10-CM | POA: Diagnosis not present

## 2023-05-01 DIAGNOSIS — F4320 Adjustment disorder, unspecified: Secondary | ICD-10-CM | POA: Diagnosis not present

## 2023-05-08 DIAGNOSIS — F4320 Adjustment disorder, unspecified: Secondary | ICD-10-CM | POA: Diagnosis not present

## 2023-05-12 DIAGNOSIS — F4320 Adjustment disorder, unspecified: Secondary | ICD-10-CM | POA: Diagnosis not present

## 2023-05-19 DIAGNOSIS — F4320 Adjustment disorder, unspecified: Secondary | ICD-10-CM | POA: Diagnosis not present

## 2023-05-27 ENCOUNTER — Encounter: Payer: Self-pay | Admitting: Nurse Practitioner

## 2023-05-29 ENCOUNTER — Ambulatory Visit
Admission: RE | Admit: 2023-05-29 | Discharge: 2023-05-29 | Disposition: A | Discharge status: Home / Self Care | Payer: BC Managed Care – PPO | Source: Ambulatory Visit | Attending: Nurse Practitioner | Admitting: Nurse Practitioner

## 2023-05-29 DIAGNOSIS — Z1239 Encounter for other screening for malignant neoplasm of breast: Secondary | ICD-10-CM | POA: Diagnosis not present

## 2023-05-29 DIAGNOSIS — Z803 Family history of malignant neoplasm of breast: Secondary | ICD-10-CM

## 2023-05-29 MED ORDER — GADOPICLENOL 0.5 MMOL/ML IV SOLN
6.0000 mL | Freq: Once | INTRAVENOUS | Status: AC | PRN
  Administered 2023-05-29: 6 mL via INTRAVENOUS

## 2023-06-02 DIAGNOSIS — F4320 Adjustment disorder, unspecified: Secondary | ICD-10-CM | POA: Diagnosis not present

## 2023-06-13 DIAGNOSIS — F4320 Adjustment disorder, unspecified: Secondary | ICD-10-CM | POA: Diagnosis not present

## 2023-08-01 DIAGNOSIS — R6882 Decreased libido: Secondary | ICD-10-CM | POA: Diagnosis not present

## 2023-08-01 DIAGNOSIS — Z79890 Hormone replacement therapy (postmenopausal): Secondary | ICD-10-CM | POA: Diagnosis not present

## 2023-08-01 DIAGNOSIS — N3941 Urge incontinence: Secondary | ICD-10-CM | POA: Diagnosis not present

## 2023-08-01 DIAGNOSIS — R102 Pelvic and perineal pain: Secondary | ICD-10-CM | POA: Diagnosis not present

## 2023-08-01 DIAGNOSIS — D259 Leiomyoma of uterus, unspecified: Secondary | ICD-10-CM | POA: Diagnosis not present

## 2023-08-07 DIAGNOSIS — N816 Rectocele: Secondary | ICD-10-CM | POA: Diagnosis not present

## 2023-08-07 DIAGNOSIS — N393 Stress incontinence (female) (male): Secondary | ICD-10-CM | POA: Diagnosis not present

## 2023-08-07 DIAGNOSIS — D259 Leiomyoma of uterus, unspecified: Secondary | ICD-10-CM | POA: Diagnosis not present

## 2023-08-07 DIAGNOSIS — N3641 Hypermobility of urethra: Secondary | ICD-10-CM | POA: Diagnosis not present

## 2023-08-11 DIAGNOSIS — F4312 Post-traumatic stress disorder, chronic: Secondary | ICD-10-CM | POA: Diagnosis not present

## 2023-08-11 DIAGNOSIS — F321 Major depressive disorder, single episode, moderate: Secondary | ICD-10-CM | POA: Diagnosis not present

## 2023-09-03 DIAGNOSIS — Z113 Encounter for screening for infections with a predominantly sexual mode of transmission: Secondary | ICD-10-CM | POA: Diagnosis not present

## 2023-09-03 DIAGNOSIS — Z0000 Encounter for general adult medical examination without abnormal findings: Secondary | ICD-10-CM | POA: Diagnosis not present

## 2023-09-03 DIAGNOSIS — Z13220 Encounter for screening for lipoid disorders: Secondary | ICD-10-CM | POA: Diagnosis not present

## 2023-09-04 DIAGNOSIS — F321 Major depressive disorder, single episode, moderate: Secondary | ICD-10-CM | POA: Diagnosis not present

## 2023-09-04 DIAGNOSIS — F4312 Post-traumatic stress disorder, chronic: Secondary | ICD-10-CM | POA: Diagnosis not present

## 2023-09-19 DIAGNOSIS — N393 Stress incontinence (female) (male): Secondary | ICD-10-CM | POA: Diagnosis not present

## 2023-09-19 DIAGNOSIS — D259 Leiomyoma of uterus, unspecified: Secondary | ICD-10-CM | POA: Diagnosis not present

## 2023-09-19 DIAGNOSIS — N3641 Hypermobility of urethra: Secondary | ICD-10-CM | POA: Diagnosis not present

## 2023-09-19 DIAGNOSIS — N816 Rectocele: Secondary | ICD-10-CM | POA: Diagnosis not present

## 2023-09-23 DIAGNOSIS — F330 Major depressive disorder, recurrent, mild: Secondary | ICD-10-CM | POA: Diagnosis not present

## 2023-09-30 DIAGNOSIS — N3641 Hypermobility of urethra: Secondary | ICD-10-CM | POA: Diagnosis not present

## 2023-09-30 DIAGNOSIS — F32A Depression, unspecified: Secondary | ICD-10-CM | POA: Diagnosis not present

## 2023-09-30 DIAGNOSIS — N393 Stress incontinence (female) (male): Secondary | ICD-10-CM | POA: Diagnosis not present

## 2023-09-30 DIAGNOSIS — Z79890 Hormone replacement therapy (postmenopausal): Secondary | ICD-10-CM | POA: Diagnosis not present

## 2023-09-30 DIAGNOSIS — D259 Leiomyoma of uterus, unspecified: Secondary | ICD-10-CM | POA: Diagnosis not present

## 2023-09-30 DIAGNOSIS — N816 Rectocele: Secondary | ICD-10-CM | POA: Diagnosis not present

## 2023-09-30 DIAGNOSIS — F419 Anxiety disorder, unspecified: Secondary | ICD-10-CM | POA: Diagnosis not present

## 2023-09-30 DIAGNOSIS — Z9889 Other specified postprocedural states: Secondary | ICD-10-CM | POA: Diagnosis not present

## 2023-09-30 DIAGNOSIS — D250 Submucous leiomyoma of uterus: Secondary | ICD-10-CM | POA: Diagnosis not present

## 2023-09-30 DIAGNOSIS — Z9049 Acquired absence of other specified parts of digestive tract: Secondary | ICD-10-CM | POA: Diagnosis not present

## 2023-09-30 DIAGNOSIS — D251 Intramural leiomyoma of uterus: Secondary | ICD-10-CM | POA: Diagnosis not present

## 2023-09-30 DIAGNOSIS — Z79899 Other long term (current) drug therapy: Secondary | ICD-10-CM | POA: Diagnosis not present

## 2023-10-01 DIAGNOSIS — F32A Depression, unspecified: Secondary | ICD-10-CM | POA: Diagnosis not present

## 2023-10-01 DIAGNOSIS — N3641 Hypermobility of urethra: Secondary | ICD-10-CM | POA: Diagnosis not present

## 2023-10-01 DIAGNOSIS — Z79890 Hormone replacement therapy (postmenopausal): Secondary | ICD-10-CM | POA: Diagnosis not present

## 2023-10-01 DIAGNOSIS — D259 Leiomyoma of uterus, unspecified: Secondary | ICD-10-CM | POA: Diagnosis not present

## 2023-10-01 DIAGNOSIS — F419 Anxiety disorder, unspecified: Secondary | ICD-10-CM | POA: Diagnosis not present

## 2023-10-01 DIAGNOSIS — N393 Stress incontinence (female) (male): Secondary | ICD-10-CM | POA: Diagnosis not present

## 2023-10-01 DIAGNOSIS — Z9889 Other specified postprocedural states: Secondary | ICD-10-CM | POA: Diagnosis not present

## 2023-10-01 DIAGNOSIS — Z9049 Acquired absence of other specified parts of digestive tract: Secondary | ICD-10-CM | POA: Diagnosis not present

## 2023-10-01 DIAGNOSIS — N816 Rectocele: Secondary | ICD-10-CM | POA: Diagnosis not present

## 2023-10-01 DIAGNOSIS — Z79899 Other long term (current) drug therapy: Secondary | ICD-10-CM | POA: Diagnosis not present

## 2023-10-15 DIAGNOSIS — N898 Other specified noninflammatory disorders of vagina: Secondary | ICD-10-CM | POA: Diagnosis not present

## 2023-10-15 DIAGNOSIS — Z4889 Encounter for other specified surgical aftercare: Secondary | ICD-10-CM | POA: Diagnosis not present

## 2023-10-24 DIAGNOSIS — Z4889 Encounter for other specified surgical aftercare: Secondary | ICD-10-CM | POA: Diagnosis not present

## 2023-10-31 DIAGNOSIS — Z90710 Acquired absence of both cervix and uterus: Secondary | ICD-10-CM | POA: Diagnosis not present

## 2023-10-31 DIAGNOSIS — M7501 Adhesive capsulitis of right shoulder: Secondary | ICD-10-CM | POA: Diagnosis not present

## 2023-10-31 DIAGNOSIS — Z79890 Hormone replacement therapy (postmenopausal): Secondary | ICD-10-CM | POA: Diagnosis not present

## 2023-11-05 ENCOUNTER — Ambulatory Visit: Payer: BC Managed Care – PPO | Admitting: Obstetrics and Gynecology

## 2023-11-06 DIAGNOSIS — F330 Major depressive disorder, recurrent, mild: Secondary | ICD-10-CM | POA: Diagnosis not present

## 2023-11-07 DIAGNOSIS — F4312 Post-traumatic stress disorder, chronic: Secondary | ICD-10-CM | POA: Diagnosis not present

## 2023-11-07 DIAGNOSIS — F321 Major depressive disorder, single episode, moderate: Secondary | ICD-10-CM | POA: Diagnosis not present

## 2023-11-13 DIAGNOSIS — F330 Major depressive disorder, recurrent, mild: Secondary | ICD-10-CM | POA: Diagnosis not present

## 2023-11-17 DIAGNOSIS — F330 Major depressive disorder, recurrent, mild: Secondary | ICD-10-CM | POA: Diagnosis not present

## 2023-11-21 DIAGNOSIS — Z4889 Encounter for other specified surgical aftercare: Secondary | ICD-10-CM | POA: Diagnosis not present

## 2023-11-24 DIAGNOSIS — F330 Major depressive disorder, recurrent, mild: Secondary | ICD-10-CM | POA: Diagnosis not present

## 2023-12-29 DIAGNOSIS — F330 Major depressive disorder, recurrent, mild: Secondary | ICD-10-CM | POA: Diagnosis not present

## 2024-01-01 DIAGNOSIS — R3989 Other symptoms and signs involving the genitourinary system: Secondary | ICD-10-CM | POA: Diagnosis not present

## 2024-01-01 DIAGNOSIS — N898 Other specified noninflammatory disorders of vagina: Secondary | ICD-10-CM | POA: Diagnosis not present

## 2024-01-01 DIAGNOSIS — N958 Other specified menopausal and perimenopausal disorders: Secondary | ICD-10-CM | POA: Diagnosis not present

## 2024-01-06 DIAGNOSIS — F330 Major depressive disorder, recurrent, mild: Secondary | ICD-10-CM | POA: Diagnosis not present

## 2024-01-12 DIAGNOSIS — F330 Major depressive disorder, recurrent, mild: Secondary | ICD-10-CM | POA: Diagnosis not present

## 2024-01-16 DIAGNOSIS — Z79890 Hormone replacement therapy (postmenopausal): Secondary | ICD-10-CM | POA: Diagnosis not present

## 2024-01-21 DIAGNOSIS — F330 Major depressive disorder, recurrent, mild: Secondary | ICD-10-CM | POA: Diagnosis not present

## 2024-01-23 DIAGNOSIS — F330 Major depressive disorder, recurrent, mild: Secondary | ICD-10-CM | POA: Diagnosis not present

## 2024-01-26 DIAGNOSIS — F330 Major depressive disorder, recurrent, mild: Secondary | ICD-10-CM | POA: Diagnosis not present

## 2024-01-27 ENCOUNTER — Ambulatory Visit: Payer: Self-pay | Attending: Family Medicine

## 2024-01-27 ENCOUNTER — Other Ambulatory Visit: Payer: Self-pay | Admitting: *Deleted

## 2024-01-27 ENCOUNTER — Other Ambulatory Visit (HOSPITAL_BASED_OUTPATIENT_CLINIC_OR_DEPARTMENT_OTHER): Payer: Self-pay | Admitting: Family Medicine

## 2024-01-27 DIAGNOSIS — R7303 Prediabetes: Secondary | ICD-10-CM

## 2024-01-27 DIAGNOSIS — E782 Mixed hyperlipidemia: Secondary | ICD-10-CM | POA: Diagnosis not present

## 2024-01-27 DIAGNOSIS — R002 Palpitations: Secondary | ICD-10-CM

## 2024-01-27 NOTE — Progress Notes (Unsigned)
 Enrolled for [employer] to mail a ZIO XT long term holter monitor to the patients address on file.   EP to read

## 2024-02-14 DIAGNOSIS — Z7182 Exercise counseling: Secondary | ICD-10-CM | POA: Diagnosis not present

## 2024-02-14 DIAGNOSIS — Z7141 Alcohol abuse counseling and surveillance of alcoholic: Secondary | ICD-10-CM | POA: Diagnosis not present

## 2024-02-14 DIAGNOSIS — Z713 Dietary counseling and surveillance: Secondary | ICD-10-CM | POA: Diagnosis not present

## 2024-02-14 DIAGNOSIS — Z1331 Encounter for screening for depression: Secondary | ICD-10-CM | POA: Diagnosis not present

## 2024-02-14 DIAGNOSIS — L235 Allergic contact dermatitis due to other chemical products: Secondary | ICD-10-CM | POA: Diagnosis not present

## 2024-03-04 DIAGNOSIS — F32A Depression, unspecified: Secondary | ICD-10-CM | POA: Diagnosis not present

## 2024-03-04 DIAGNOSIS — F4322 Adjustment disorder with anxiety: Secondary | ICD-10-CM | POA: Diagnosis not present

## 2024-03-05 ENCOUNTER — Encounter (HOSPITAL_BASED_OUTPATIENT_CLINIC_OR_DEPARTMENT_OTHER): Payer: Self-pay

## 2024-03-05 ENCOUNTER — Other Ambulatory Visit (HOSPITAL_BASED_OUTPATIENT_CLINIC_OR_DEPARTMENT_OTHER): Payer: Self-pay

## 2024-03-11 DIAGNOSIS — F32A Depression, unspecified: Secondary | ICD-10-CM | POA: Diagnosis not present

## 2024-03-11 DIAGNOSIS — F4322 Adjustment disorder with anxiety: Secondary | ICD-10-CM | POA: Diagnosis not present

## 2024-03-26 DIAGNOSIS — R002 Palpitations: Secondary | ICD-10-CM

## 2024-04-08 DIAGNOSIS — F32A Depression, unspecified: Secondary | ICD-10-CM | POA: Diagnosis not present

## 2024-04-08 DIAGNOSIS — F4322 Adjustment disorder with anxiety: Secondary | ICD-10-CM | POA: Diagnosis not present

## 2024-04-13 ENCOUNTER — Ambulatory Visit (HOSPITAL_BASED_OUTPATIENT_CLINIC_OR_DEPARTMENT_OTHER)
Admission: RE | Admit: 2024-04-13 | Discharge: 2024-04-13 | Disposition: A | Discharge status: Home / Self Care | Payer: Self-pay | Source: Ambulatory Visit | Attending: Family Medicine | Admitting: Family Medicine

## 2024-04-13 DIAGNOSIS — E782 Mixed hyperlipidemia: Secondary | ICD-10-CM | POA: Insufficient documentation

## 2024-04-13 DIAGNOSIS — R7303 Prediabetes: Secondary | ICD-10-CM | POA: Insufficient documentation

## 2024-04-29 DIAGNOSIS — F4322 Adjustment disorder with anxiety: Secondary | ICD-10-CM | POA: Diagnosis not present

## 2024-04-29 DIAGNOSIS — F32A Depression, unspecified: Secondary | ICD-10-CM | POA: Diagnosis not present

## 2024-05-12 DIAGNOSIS — F32A Depression, unspecified: Secondary | ICD-10-CM | POA: Diagnosis not present

## 2024-05-12 DIAGNOSIS — F4322 Adjustment disorder with anxiety: Secondary | ICD-10-CM | POA: Diagnosis not present

## 2024-05-12 DIAGNOSIS — F4312 Post-traumatic stress disorder, chronic: Secondary | ICD-10-CM | POA: Diagnosis not present

## 2024-05-20 DIAGNOSIS — M2550 Pain in unspecified joint: Secondary | ICD-10-CM | POA: Diagnosis not present

## 2024-05-20 DIAGNOSIS — K900 Celiac disease: Secondary | ICD-10-CM | POA: Diagnosis not present

## 2024-05-20 DIAGNOSIS — R252 Cramp and spasm: Secondary | ICD-10-CM | POA: Diagnosis not present

## 2024-05-25 DIAGNOSIS — F4322 Adjustment disorder with anxiety: Secondary | ICD-10-CM | POA: Diagnosis not present

## 2024-05-25 DIAGNOSIS — F4312 Post-traumatic stress disorder, chronic: Secondary | ICD-10-CM | POA: Diagnosis not present

## 2024-06-08 DIAGNOSIS — F4312 Post-traumatic stress disorder, chronic: Secondary | ICD-10-CM | POA: Diagnosis not present

## 2024-06-08 DIAGNOSIS — F32A Depression, unspecified: Secondary | ICD-10-CM | POA: Diagnosis not present

## 2024-06-08 DIAGNOSIS — F4322 Adjustment disorder with anxiety: Secondary | ICD-10-CM | POA: Diagnosis not present

## 2024-06-15 DIAGNOSIS — F4312 Post-traumatic stress disorder, chronic: Secondary | ICD-10-CM | POA: Diagnosis not present

## 2024-06-15 DIAGNOSIS — F32A Depression, unspecified: Secondary | ICD-10-CM | POA: Diagnosis not present

## 2024-06-15 DIAGNOSIS — F4322 Adjustment disorder with anxiety: Secondary | ICD-10-CM | POA: Diagnosis not present

## 2024-06-22 DIAGNOSIS — F4322 Adjustment disorder with anxiety: Secondary | ICD-10-CM | POA: Diagnosis not present

## 2024-06-22 DIAGNOSIS — F4312 Post-traumatic stress disorder, chronic: Secondary | ICD-10-CM | POA: Diagnosis not present

## 2024-06-22 DIAGNOSIS — F32A Depression, unspecified: Secondary | ICD-10-CM | POA: Diagnosis not present

## 2024-06-28 DIAGNOSIS — F32A Depression, unspecified: Secondary | ICD-10-CM | POA: Diagnosis not present

## 2024-06-28 DIAGNOSIS — F4322 Adjustment disorder with anxiety: Secondary | ICD-10-CM | POA: Diagnosis not present

## 2024-06-28 DIAGNOSIS — F4312 Post-traumatic stress disorder, chronic: Secondary | ICD-10-CM | POA: Diagnosis not present

## 2024-07-02 DIAGNOSIS — Z01419 Encounter for gynecological examination (general) (routine) without abnormal findings: Secondary | ICD-10-CM | POA: Diagnosis not present

## 2024-07-06 DIAGNOSIS — F32A Depression, unspecified: Secondary | ICD-10-CM | POA: Diagnosis not present

## 2024-07-06 DIAGNOSIS — F4322 Adjustment disorder with anxiety: Secondary | ICD-10-CM | POA: Diagnosis not present

## 2024-07-06 DIAGNOSIS — F4312 Post-traumatic stress disorder, chronic: Secondary | ICD-10-CM | POA: Diagnosis not present
